# Patient Record
Sex: Male | Born: 1945 | Race: White | Hispanic: No | Marital: Married | Smoking: Former smoker
Health system: Southern US, Community
[De-identification: ages and names within clinical notes are randomized; demographics above are authoritative.]

## PROBLEM LIST (undated history)

## (undated) DIAGNOSIS — J189 Pneumonia, unspecified organism: Secondary | ICD-10-CM

## (undated) DIAGNOSIS — C439 Malignant melanoma of skin, unspecified: Secondary | ICD-10-CM

## (undated) DIAGNOSIS — I10 Essential (primary) hypertension: Secondary | ICD-10-CM

## (undated) DIAGNOSIS — E7800 Pure hypercholesterolemia, unspecified: Secondary | ICD-10-CM

## (undated) DIAGNOSIS — I2510 Atherosclerotic heart disease of native coronary artery without angina pectoris: Secondary | ICD-10-CM

## (undated) DIAGNOSIS — K219 Gastro-esophageal reflux disease without esophagitis: Secondary | ICD-10-CM

## (undated) DIAGNOSIS — I509 Heart failure, unspecified: Secondary | ICD-10-CM

## (undated) DIAGNOSIS — M1990 Unspecified osteoarthritis, unspecified site: Secondary | ICD-10-CM

## (undated) DIAGNOSIS — E119 Type 2 diabetes mellitus without complications: Secondary | ICD-10-CM

## (undated) HISTORY — PX: CATARACT EXTRACTION W/ INTRAOCULAR LENS  IMPLANT, BILATERAL: SHX1307

## (undated) HISTORY — PX: CORONARY ANGIOPLASTY WITH STENT PLACEMENT: SHX49

## (undated) HISTORY — PX: OTHER SURGICAL HISTORY: SHX169

## (undated) HISTORY — PX: COLONOSCOPY: SHX174

## (undated) HISTORY — PX: KNEE ARTHROSCOPY: SHX127

---

## 2008-10-24 DIAGNOSIS — C439 Malignant melanoma of skin, unspecified: Secondary | ICD-10-CM

## 2008-10-24 HISTORY — DX: Malignant melanoma of skin, unspecified: C43.9

## 2008-10-24 HISTORY — PX: MELANOMA EXCISION: SHX5266

## 2009-11-22 ENCOUNTER — Emergency Department (HOSPITAL_COMMUNITY): Admission: EM | Admit: 2009-11-22 | Discharge: 2009-11-22 | Payer: Self-pay | Admitting: Emergency Medicine

## 2014-10-24 HISTORY — PX: LITHOTRIPSY: SUR834

## 2015-06-03 ENCOUNTER — Emergency Department (HOSPITAL_COMMUNITY): Payer: Medicare Other

## 2015-06-03 ENCOUNTER — Encounter (HOSPITAL_COMMUNITY): Payer: Self-pay | Admitting: *Deleted

## 2015-06-03 ENCOUNTER — Emergency Department (HOSPITAL_COMMUNITY)
Admission: EM | Admit: 2015-06-03 | Discharge: 2015-06-03 | Disposition: A | Discharge status: Home / Self Care | Payer: Medicare Other | Attending: Physician Assistant | Admitting: Physician Assistant

## 2015-06-03 DIAGNOSIS — N200 Calculus of kidney: Secondary | ICD-10-CM | POA: Diagnosis not present

## 2015-06-03 DIAGNOSIS — R109 Unspecified abdominal pain: Secondary | ICD-10-CM | POA: Insufficient documentation

## 2015-06-03 HISTORY — DX: Essential (primary) hypertension: I10

## 2015-06-03 LAB — URINALYSIS, ROUTINE W REFLEX MICROSCOPIC
Bilirubin Urine: NEGATIVE
Glucose, UA: 100 mg/dL — AB
Ketones, ur: NEGATIVE mg/dL
LEUKOCYTES UA: NEGATIVE
Nitrite: NEGATIVE
Protein, ur: NEGATIVE mg/dL
Specific Gravity, Urine: 1.021 (ref 1.005–1.030)
Urobilinogen, UA: 0.2 mg/dL (ref 0.0–1.0)
pH: 6.5 (ref 5.0–8.0)

## 2015-06-03 LAB — I-STAT TROPONIN, ED: Troponin i, poc: 0.01 ng/mL (ref 0.00–0.08)

## 2015-06-03 LAB — CBC WITH DIFFERENTIAL/PLATELET
BASOS PCT: 0 % (ref 0–1)
Basophils Absolute: 0 10*3/uL (ref 0.0–0.1)
Eosinophils Absolute: 0.1 10*3/uL (ref 0.0–0.7)
Eosinophils Relative: 0 % (ref 0–5)
HEMATOCRIT: 46.3 % (ref 39.0–52.0)
HEMOGLOBIN: 16.1 g/dL (ref 13.0–17.0)
LYMPHS ABS: 2.1 10*3/uL (ref 0.7–4.0)
LYMPHS PCT: 15 % (ref 12–46)
MCH: 31.6 pg (ref 26.0–34.0)
MCHC: 34.8 g/dL (ref 30.0–36.0)
MCV: 90.8 fL (ref 78.0–100.0)
MONOS PCT: 8 % (ref 3–12)
Monocytes Absolute: 1.2 10*3/uL — ABNORMAL HIGH (ref 0.1–1.0)
NEUTROS ABS: 10.8 10*3/uL — AB (ref 1.7–7.7)
NEUTROS PCT: 77 % (ref 43–77)
Platelets: 236 10*3/uL (ref 150–400)
RBC: 5.1 MIL/uL (ref 4.22–5.81)
RDW: 12.5 % (ref 11.5–15.5)
WBC: 14.1 10*3/uL — AB (ref 4.0–10.5)

## 2015-06-03 LAB — URINE MICROSCOPIC-ADD ON

## 2015-06-03 LAB — BASIC METABOLIC PANEL
Anion gap: 12 (ref 5–15)
BUN: 25 mg/dL — AB (ref 6–20)
CO2: 23 mmol/L (ref 22–32)
Calcium: 9.8 mg/dL (ref 8.9–10.3)
Chloride: 104 mmol/L (ref 101–111)
Creatinine, Ser: 1.53 mg/dL — ABNORMAL HIGH (ref 0.61–1.24)
GFR calc Af Amer: 53 mL/min — ABNORMAL LOW (ref >60)
GFR calc non Af Amer: 45 mL/min — ABNORMAL LOW (ref >60)
GLUCOSE: 147 mg/dL — AB (ref 65–99)
Potassium: 4.1 mmol/L (ref 3.5–5.1)
SODIUM: 139 mmol/L (ref 135–145)

## 2015-06-03 LAB — LIPASE, BLOOD: Lipase: 43 U/L (ref 22–51)

## 2015-06-03 MED ORDER — OXYCODONE-ACETAMINOPHEN 5-325 MG PO TABS: 1.0000 | ORAL_TABLET | Freq: Four times a day (QID) | ORAL | Status: DC | PRN

## 2015-06-03 MED ORDER — TAMSULOSIN HCL 0.4 MG PO CAPS: 0.4000 mg | ORAL_CAPSULE | Freq: Every day | ORAL | Status: DC

## 2015-06-03 MED ORDER — ONDANSETRON HCL 4 MG PO TABS: 4.0000 mg | ORAL_TABLET | Freq: Three times a day (TID) | ORAL | Status: DC | PRN

## 2015-06-03 NOTE — ED Notes (Signed)
Pt returned from X-ray.  

## 2015-06-03 NOTE — ED Provider Notes (Signed)
CSN: [id number]     Arrival date & time 06/03/15  7408 History   First MD Initiated Contact with Patient 06/03/15 0930     Chief Complaint  Patient presents with  . Flank Pain     (Consider location/radiation/quality/duration/timing/severity/associated sxs/prior Treatment) HPI   Patient is a 69 year old male presenting today with back pain radiating to his left inguinal area. Pain woke him up in the middle night. Patient's had no trouble urinating. No fevers. No nausea no vomiting no diarrhea. One soft stool yesterday morning. Patient did do some vigorous yard work yesterday. It is somewhat made worse by moving. Patient has no history of kidney stones. Past history only significant for cardiac disease and GERD.  No past medical history on file. No past surgical history on file. No family history on file. Social History  Substance Use Topics  . Smoking status: Not on file  . Smokeless tobacco: Not on file  . Alcohol Use: Not on file    Review of Systems  Constitutional: Negative for fever and activity change.  HENT: Negative for drooling and hearing loss.   Eyes: Negative for discharge and redness.  Respiratory: Negative for cough and shortness of breath.   Cardiovascular: Negative for chest pain.  Gastrointestinal: Negative for nausea, abdominal pain and diarrhea.  Genitourinary: Negative for dysuria and urgency.  Musculoskeletal: Negative for arthralgias.  Allergic/Immunologic: Negative for immunocompromised state.  Neurological: Negative for seizures and speech difficulty.  Psychiatric/Behavioral: Negative for behavioral problems and agitation.  All other systems reviewed and are negative.     Allergies  Review of patient's allergies indicates not on file.  Home Medications   Prior to Admission medications   Not on File   BP 153/92 mmHg  Pulse 70  Temp(Src) 98.7 F (37.1 C) (Oral)  Resp 20  SpO2 94% Physical Exam  Constitutional: He is oriented to person,  place, and time. He appears well-nourished.  HENT:  Head: Normocephalic.  Mouth/Throat: Oropharynx is clear and moist.  Eyes: Conjunctivae are normal.  Neck: No tracheal deviation present.  Cardiovascular: Normal rate.   Pulmonary/Chest: Effort normal. No stridor. No respiratory distress.  Abdominal: Soft. There is no tenderness. There is no guarding.  No CVA tenderness. No abdominal tenderness.  Musculoskeletal: Normal range of motion. He exhibits no edema.  Neurological: He is oriented to person, place, and time. No cranial nerve deficit.  Skin: Skin is warm and dry. No rash noted. He is not diaphoretic.  Psychiatric: He has a normal mood and affect. His behavior is normal.  Nursing note and vitals reviewed.   ED Course  Procedures (including critical care time) Labs Review Labs Reviewed  URINALYSIS, ROUTINE W REFLEX MICROSCOPIC (NOT AT [facility])    Imaging Review No results found.   EKG Interpretation None      MDM   Final diagnoses:  None    She is a healthy 69 year old gentleman with past medical history significant for cardiac disease and GERD. He is presenting today with left flank pain radiating to his left groin. Differential includes kidney stone, gas pains, msk pain, and dissection. No pain is reproducible on exam. We'll get urine to look for evidence of kidney stone. If no evidence of etiology of pain on urine (blood- non con CT)  Then we'll get CT with contrast. Given patient's risk factors for dissection or other abdominal etiology of this pain.  Patient's vital signs and physical exam are normal.    Discussed with urology. They are okay with follow  up as outpatient.     Courteney Julio Alm, MD 06/03/15 [phone]

## 2015-06-03 NOTE — ED Notes (Signed)
Went to collect blood samples,RN was in the process of starting an IV and stated that they would collect the samples

## 2015-06-03 NOTE — ED Notes (Signed)
Pt reports sudden onset L flank pain, L abd pain that woke him up out of sleep at 4am today. +nausea, denies vomiting. Denies urinary complaints. Denies Hx kidney stones.

## 2015-06-03 NOTE — Discharge Instructions (Signed)
You have a kidney stone. Please return to see her regular physician this week. You need your creatinine checked.

## 2015-06-04 LAB — URINE CULTURE: Culture: NO GROWTH

## 2015-07-01 ENCOUNTER — Other Ambulatory Visit: Payer: Self-pay | Admitting: Urology

## 2015-07-07 ENCOUNTER — Encounter (HOSPITAL_COMMUNITY): Payer: Self-pay | Admitting: *Deleted

## 2015-07-09 ENCOUNTER — Encounter (HOSPITAL_COMMUNITY): Payer: Self-pay

## 2015-07-09 ENCOUNTER — Encounter (HOSPITAL_COMMUNITY)
Admission: RE | Disposition: A | Discharge status: Home / Self Care | Payer: Self-pay | Source: Ambulatory Visit | Attending: Urology

## 2015-07-09 ENCOUNTER — Ambulatory Visit (HOSPITAL_COMMUNITY): Payer: Medicare Other

## 2015-07-09 ENCOUNTER — Ambulatory Visit (HOSPITAL_COMMUNITY)
Admission: RE | Admit: 2015-07-09 | Discharge: 2015-07-09 | Disposition: A | Discharge status: Home / Self Care | Payer: Medicare Other | Source: Ambulatory Visit | Attending: Urology | Admitting: Urology

## 2015-07-09 DIAGNOSIS — Z79899 Other long term (current) drug therapy: Secondary | ICD-10-CM | POA: Diagnosis not present

## 2015-07-09 DIAGNOSIS — E780 Pure hypercholesterolemia: Secondary | ICD-10-CM | POA: Insufficient documentation

## 2015-07-09 DIAGNOSIS — N201 Calculus of ureter: Secondary | ICD-10-CM

## 2015-07-09 DIAGNOSIS — Z6828 Body mass index (BMI) 28.0-28.9, adult: Secondary | ICD-10-CM | POA: Insufficient documentation

## 2015-07-09 DIAGNOSIS — I10 Essential (primary) hypertension: Secondary | ICD-10-CM | POA: Insufficient documentation

## 2015-07-09 DIAGNOSIS — Z87891 Personal history of nicotine dependence: Secondary | ICD-10-CM | POA: Diagnosis not present

## 2015-07-09 DIAGNOSIS — Z7982 Long term (current) use of aspirin: Secondary | ICD-10-CM | POA: Diagnosis not present

## 2015-07-09 LAB — GLUCOSE, CAPILLARY: GLUCOSE-CAPILLARY: 103 mg/dL — AB (ref 65–99)

## 2015-07-09 SURGERY — LITHOTRIPSY, ESWL
Anesthesia: LOCAL | Laterality: Left

## 2015-07-09 MED ORDER — TRAMADOL HCL 50 MG PO TABS: 50.0000 mg | ORAL_TABLET | Freq: Four times a day (QID) | ORAL | Status: DC | PRN

## 2015-07-09 MED ORDER — DIPHENHYDRAMINE HCL 25 MG PO CAPS
25.0000 mg | ORAL_CAPSULE | ORAL | Status: AC
  Administered 2015-07-09: 25 mg via ORAL
  Filled 2015-07-09: qty 1

## 2015-07-09 MED ORDER — DIAZEPAM 5 MG PO TABS
10.0000 mg | ORAL_TABLET | ORAL | Status: AC
  Administered 2015-07-09: 10 mg via ORAL
  Filled 2015-07-09: qty 2

## 2015-07-09 MED ORDER — CIPROFLOXACIN HCL 500 MG PO TABS
500.0000 mg | ORAL_TABLET | ORAL | Status: AC
Start: 2015-07-09 — End: 2015-07-09
  Administered 2015-07-09: 500 mg via ORAL
  Filled 2015-07-09: qty 1

## 2015-07-09 MED ORDER — SODIUM CHLORIDE 0.9 % IV SOLN
INTRAVENOUS | Status: DC
  Administered 2015-07-09: 09:00:00 via INTRAVENOUS

## 2015-07-09 NOTE — Op Note (Signed)
See [facility] OP note scanned into chart. Also because of the size, density, location and other factors that cannot be anticipated I feel this will likely be a staged procedure. This fact supersedes any indication in the scanned [facility] operative note to the contrary.  

## 2015-07-09 NOTE — Interval H&P Note (Signed)
History and Physical Interval Note: No changes. NAD RRR CTA-B  Ok to proceed with scheduled procedure. 07/09/2015 5:15 AM  Brandon Morrow  has presented today for surgery, with the diagnosis of LEFT MID URETERAL STONE  The various methods of treatment have been discussed with the patient and family. After consideration of risks, benefits and other options for treatment, the patient has consented to  Procedure(s): LEFT EXTRACORPOREAL SHOCK WAVE LITHOTRIPSY (ESWL) (Left) as a surgical intervention .  The patient's history has been reviewed, patient examined, no change in status, stable for surgery.  I have reviewed the patient's chart and labs.  Questions were answered to the patient's satisfaction.     Louis Meckel W

## 2015-07-09 NOTE — Discharge Instructions (Signed)
See [facility] discharge instructions in chart.  

## 2015-07-09 NOTE — H&P (Signed)
Reason For Visit    Brandon Morrow is a 69 year old male with a left ureteral stone.   History of Present Illness Calculus disease: CT scan 06/03/15 - 6 mm stone in the left ureter located at the level of the L4 vertebral body. No renal calculi were identified.    Interval history: He was placed on medical expulsive therapy. He has been doing well and has not had any flank pain or hematuria. He has not seen his stone passed. No new complaints are noted today.    PSA in 3/16 was 0.03   Past Medical History Problems  1. History of hypercholesterolemia (Z86.39) 2. History of Morbid obesity (E66.01)  Surgical History Problems  1. History of Arthroscopy Knee 2. History of Cataract Surgery  Current Meds 1. AmLODIPine Besylate 5 MG Oral Tablet;  Therapy: (Recorded:25Aug2016) to Recorded 2. Aspirin 325 MG Oral Tablet;  Therapy: (Recorded:25Aug2016) to Recorded 3. Fish Oil Concentrate 1000 MG Oral Capsule;  Therapy: (Recorded:25Aug2016) to Recorded 4. Latanoprost 0.005 % Ophthalmic Solution;  Therapy: (Recorded:25Aug2016) to Recorded 5. Lipitor 10 MG Oral Tablet;  Therapy: (Recorded:25Aug2016) to Recorded 6. Loratadine 10 MG Oral Tablet;  Therapy: (Recorded:25Aug2016) to Recorded 7. MetFORMIN HCl - 1000 MG Oral Tablet;  Therapy: (Recorded:25Aug2016) to Recorded 8. Multi-Vitamin TABS;  Therapy: (Recorded:25Aug2016) to Recorded 9. Vitamin D 400 UNIT CAPS;  Therapy: (Recorded:25Aug2016) to Recorded  Allergies Medication  1. Codeine Derivatives 2. Sulfa Drugs  Family History Problems  1. Family history of diabetes mellitus (Z83.3) : Mother, Sister 2. Family history of malignant neoplasm of breast (Z80.3) : Mother 3. Family history of Parkinson's disease (Z82.0) : Father  Social History Problems  1. Alcohol use (Z78.9)   maybe 1 drink per week 2. Former smoker [phone]) 3. Married  Review of Systems Genitourinary, constitutional, skin, eye, otolaryngeal,  hematologic/lymphatic, cardiovascular, pulmonary, endocrine, musculoskeletal, gastrointestinal, neurological and psychiatric system(s) were reviewed and pertinent findings if present are noted and are otherwise negative.  Gastrointestinal: flank pain and heartburn.  ENT: sinus problems.  Musculoskeletal: back pain and joint pain.    Vitals Vital Signs [Data Includes: Last 1 Day]  Recorded: 07Sep2016 09:54AM  Height: 5 ft 9 in Weight: 195 lb  BMI Calculated: 28.8 BSA Calculated: 2.04 Blood Pressure: 120 / 65 Temperature: 97.4 F Heart Rate: 72  Results/Data Urine [Data Includes: Last 1 Day]   [id number]  COLOR YELLOW   APPEARANCE CLEAR   SPECIFIC GRAVITY 1.010   pH 6.0   GLUCOSE 2+   BILIRUBIN NEGATIVE   KETONE NEGATIVE   BLOOD TRACE   PROTEIN NEGATIVE   NITRITE NEGATIVE   LEUKOCYTE ESTERASE NEGATIVE   SQUAMOUS EPITHELIAL/HPF 0-5 HPF  WBC 0-5 WBC/HPF  RBC 0-2 RBC/HPF  BACTERIA NONE SEEN HPF  CRYSTALS NONE SEEN HPF  CASTS NONE SEEN LPF  Yeast NONE SEEN HPF   Assessment Assessed  1. Ureteral calculus, left (N20.1)     It appears his down still present within the left ureter. It is at the level of the L3-L4 interspace on the left-hand side.  My measurement of the stones with is 4 mm wide.  His studies failed to progress. Fortunately he is not having any pain. We did discuss the concept of solid obstruction and the need for treatment. I had previously described the options to him but I again went over lithotripsy and ureteroscopy in detail. He is elected to proceed with lithotripsy.   Plan Health Maintenance  1. UA With REFLEX; [Do Not Release]; Status:Complete;   Done: [id number]  09:45AM Ureteral calculus, left  2. Start: Tamsulosin HCl - 0.4 MG Oral Capsule; TAKE 1 CAPSULE Daily 3. Follow-up Schedule Surgery Office  Follow-up  Status: Hold For - Appointment   Requested for: 07Sep2016  1. Continue tamsulosin.  2. He will be scheduled for lithotripsy of his left  renal stone.

## 2015-09-08 ENCOUNTER — Encounter: Payer: Self-pay | Admitting: Internal Medicine

## 2015-10-07 ENCOUNTER — Other Ambulatory Visit: Payer: Self-pay | Admitting: Urology

## 2015-10-08 ENCOUNTER — Encounter (HOSPITAL_BASED_OUTPATIENT_CLINIC_OR_DEPARTMENT_OTHER): Payer: Self-pay | Admitting: *Deleted

## 2015-10-08 NOTE — Progress Notes (Signed)
NPO AFTER MN.  ARRIVE AT 0745.  NEEDS ISTAT , KUB, AND EKG.

## 2015-10-12 ENCOUNTER — Ambulatory Visit (HOSPITAL_COMMUNITY): Payer: Medicare Other

## 2015-10-12 ENCOUNTER — Ambulatory Visit (HOSPITAL_BASED_OUTPATIENT_CLINIC_OR_DEPARTMENT_OTHER): Payer: Medicare Other | Admitting: Anesthesiology

## 2015-10-12 ENCOUNTER — Encounter (HOSPITAL_BASED_OUTPATIENT_CLINIC_OR_DEPARTMENT_OTHER)
Admission: RE | Disposition: A | Discharge status: Home / Self Care | Payer: Self-pay | Source: Ambulatory Visit | Attending: Urology

## 2015-10-12 ENCOUNTER — Ambulatory Visit (HOSPITAL_BASED_OUTPATIENT_CLINIC_OR_DEPARTMENT_OTHER)
Admission: RE | Admit: 2015-10-12 | Discharge: 2015-10-12 | Disposition: A | Discharge status: Home / Self Care | Payer: Medicare Other | Source: Ambulatory Visit | Attending: Urology | Admitting: Urology

## 2015-10-12 ENCOUNTER — Encounter (HOSPITAL_BASED_OUTPATIENT_CLINIC_OR_DEPARTMENT_OTHER): Payer: Self-pay | Admitting: Anesthesiology

## 2015-10-12 DIAGNOSIS — Z79899 Other long term (current) drug therapy: Secondary | ICD-10-CM | POA: Diagnosis not present

## 2015-10-12 DIAGNOSIS — E7800 Pure hypercholesterolemia, unspecified: Secondary | ICD-10-CM | POA: Insufficient documentation

## 2015-10-12 DIAGNOSIS — Z7982 Long term (current) use of aspirin: Secondary | ICD-10-CM | POA: Insufficient documentation

## 2015-10-12 DIAGNOSIS — Z87891 Personal history of nicotine dependence: Secondary | ICD-10-CM | POA: Diagnosis not present

## 2015-10-12 DIAGNOSIS — Z6828 Body mass index (BMI) 28.0-28.9, adult: Secondary | ICD-10-CM | POA: Insufficient documentation

## 2015-10-12 DIAGNOSIS — H409 Unspecified glaucoma: Secondary | ICD-10-CM | POA: Diagnosis not present

## 2015-10-12 DIAGNOSIS — N201 Calculus of ureter: Secondary | ICD-10-CM | POA: Insufficient documentation

## 2015-10-12 DIAGNOSIS — E119 Type 2 diabetes mellitus without complications: Secondary | ICD-10-CM | POA: Diagnosis not present

## 2015-10-12 DIAGNOSIS — Z87442 Personal history of urinary calculi: Secondary | ICD-10-CM | POA: Diagnosis not present

## 2015-10-12 DIAGNOSIS — Z7984 Long term (current) use of oral hypoglycemic drugs: Secondary | ICD-10-CM | POA: Insufficient documentation

## 2015-10-12 DIAGNOSIS — I10 Essential (primary) hypertension: Secondary | ICD-10-CM | POA: Insufficient documentation

## 2015-10-12 HISTORY — PX: HOLMIUM LASER APPLICATION: SHX5852

## 2015-10-12 HISTORY — PX: CYSTOSCOPY WITH RETROGRADE PYELOGRAM, URETEROSCOPY AND STENT PLACEMENT: SHX5789

## 2015-10-12 LAB — POCT I-STAT 4, (NA,K, GLUC, HGB,HCT)
Glucose, Bld: 165 mg/dL — ABNORMAL HIGH (ref 65–99)
HCT: 45 % (ref 39.0–52.0)
Hemoglobin: 15.3 g/dL (ref 13.0–17.0)
Potassium: 3.7 mmol/L (ref 3.5–5.1)
Sodium: 139 mmol/L (ref 135–145)

## 2015-10-12 LAB — GLUCOSE, CAPILLARY: Glucose-Capillary: 152 mg/dL — ABNORMAL HIGH (ref 65–99)

## 2015-10-12 SURGERY — CYSTOURETEROSCOPY, WITH RETROGRADE PYELOGRAM AND STENT INSERTION
Anesthesia: General | Laterality: Left

## 2015-10-12 MED ORDER — FENTANYL CITRATE (PF) 100 MCG/2ML IJ SOLN
INTRAMUSCULAR | Status: AC
  Filled 2015-10-12: qty 2

## 2015-10-12 MED ORDER — CIPROFLOXACIN IN D5W 200 MG/100ML IV SOLN
200.0000 mg | INTRAVENOUS | Status: AC
  Administered 2015-10-12: 200 mg via INTRAVENOUS
  Filled 2015-10-12: qty 100

## 2015-10-12 MED ORDER — TRAMADOL HCL 50 MG PO TABS
ORAL_TABLET | ORAL | Status: AC
  Filled 2015-10-12: qty 1

## 2015-10-12 MED ORDER — TRAMADOL HCL 50 MG PO TABS: 50.0000 mg | ORAL_TABLET | Freq: Four times a day (QID) | ORAL | Status: DC | PRN

## 2015-10-12 MED ORDER — KETOROLAC TROMETHAMINE 30 MG/ML IJ SOLN
INTRAMUSCULAR | Status: AC
  Filled 2015-10-12: qty 1

## 2015-10-12 MED ORDER — PHENAZOPYRIDINE HCL 100 MG PO TABS
ORAL_TABLET | ORAL | Status: AC
  Filled 2015-10-12: qty 2

## 2015-10-12 MED ORDER — DEXAMETHASONE SODIUM PHOSPHATE 10 MG/ML IJ SOLN
INTRAMUSCULAR | Status: AC
  Filled 2015-10-12: qty 1

## 2015-10-12 MED ORDER — LACTATED RINGERS IV SOLN
INTRAVENOUS | Status: DC
  Administered 2015-10-12: 08:00:00 via INTRAVENOUS
  Filled 2015-10-12: qty 1000

## 2015-10-12 MED ORDER — PROPOFOL 10 MG/ML IV BOLUS
INTRAVENOUS | Status: AC
  Filled 2015-10-12: qty 20

## 2015-10-12 MED ORDER — MIDAZOLAM HCL 2 MG/2ML IJ SOLN
INTRAMUSCULAR | Status: AC
  Filled 2015-10-12: qty 2

## 2015-10-12 MED ORDER — LIDOCAINE HCL (CARDIAC) 20 MG/ML IV SOLN
INTRAVENOUS | Status: AC
  Filled 2015-10-12: qty 5

## 2015-10-12 MED ORDER — PROPOFOL 10 MG/ML IV BOLUS
INTRAVENOUS | Status: DC | PRN
  Administered 2015-10-12: 200 mg via INTRAVENOUS

## 2015-10-12 MED ORDER — FENTANYL CITRATE (PF) 100 MCG/2ML IJ SOLN
25.0000 ug | INTRAMUSCULAR | Status: DC | PRN
  Filled 2015-10-12: qty 1

## 2015-10-12 MED ORDER — LIDOCAINE HCL (CARDIAC) 20 MG/ML IV SOLN
INTRAVENOUS | Status: DC | PRN
  Administered 2015-10-12: 100 mg via INTRAVENOUS

## 2015-10-12 MED ORDER — FENTANYL CITRATE (PF) 100 MCG/2ML IJ SOLN
INTRAMUSCULAR | Status: DC | PRN
  Administered 2015-10-12 (×2): 25 ug via INTRAVENOUS
  Administered 2015-10-12: 50 ug via INTRAVENOUS

## 2015-10-12 MED ORDER — CIPROFLOXACIN IN D5W 200 MG/100ML IV SOLN
INTRAVENOUS | Status: AC
  Filled 2015-10-12: qty 100

## 2015-10-12 MED ORDER — EPHEDRINE SULFATE 50 MG/ML IJ SOLN
INTRAMUSCULAR | Status: AC
  Filled 2015-10-12: qty 1

## 2015-10-12 MED ORDER — LACTATED RINGERS IV SOLN
INTRAVENOUS | Status: DC
  Filled 2015-10-12: qty 1000

## 2015-10-12 MED ORDER — TRAMADOL HCL 50 MG PO TABS
50.0000 mg | ORAL_TABLET | Freq: Four times a day (QID) | ORAL | Status: AC | PRN
  Administered 2015-10-12: 50 mg via ORAL
  Filled 2015-10-12: qty 1

## 2015-10-12 MED ORDER — ONDANSETRON HCL 4 MG/2ML IJ SOLN
INTRAMUSCULAR | Status: AC
  Filled 2015-10-12: qty 2

## 2015-10-12 MED ORDER — PHENAZOPYRIDINE HCL 200 MG PO TABS
200.0000 mg | ORAL_TABLET | Freq: Three times a day (TID) | ORAL | Status: DC
  Administered 2015-10-12: 200 mg via ORAL
  Filled 2015-10-12: qty 1

## 2015-10-12 MED ORDER — PHENAZOPYRIDINE HCL 200 MG PO TABS: 200.0000 mg | ORAL_TABLET | Freq: Three times a day (TID) | ORAL | Status: DC | PRN

## 2015-10-12 MED ORDER — DEXAMETHASONE SODIUM PHOSPHATE 4 MG/ML IJ SOLN
INTRAMUSCULAR | Status: DC | PRN
  Administered 2015-10-12: 8 mg via INTRAVENOUS

## 2015-10-12 MED ORDER — EPHEDRINE SULFATE 50 MG/ML IJ SOLN
INTRAMUSCULAR | Status: DC | PRN
  Administered 2015-10-12: 10 mg via INTRAVENOUS

## 2015-10-12 MED ORDER — ONDANSETRON HCL 4 MG/2ML IJ SOLN
INTRAMUSCULAR | Status: DC | PRN
  Administered 2015-10-12: 4 mg via INTRAVENOUS

## 2015-10-12 MED ORDER — IOHEXOL 350 MG/ML SOLN
INTRAVENOUS | Status: DC | PRN
  Administered 2015-10-12: 9 mL via URETHRAL

## 2015-10-12 SURGICAL SUPPLY — 39 items
ADAPTER CATH URET PLST 4-6FR (CATHETERS) IMPLANT
BAG DRAIN URO-CYSTO SKYTR STRL (DRAIN) ×2 IMPLANT
BASKET DAKOTA 1.9FR 11X120 (BASKET) ×2 IMPLANT
BASKET LASER NITINOL 1.9FR (BASKET) IMPLANT
BASKET STNLS GEMINI 4WIRE 3FR (BASKET) IMPLANT
BASKET ZERO TIP NITINOL 2.4FR (BASKET) IMPLANT
CANISTER SUCT LVC 12 LTR MEDI- (MISCELLANEOUS) IMPLANT
CATH CLEAR GEL 3F BACKSTOP (CATHETERS) IMPLANT
CATH INTERMIT  6FR 70CM (CATHETERS) IMPLANT
CATH ROBINSON RED A/P 16FR (CATHETERS) ×2 IMPLANT
CATH URET 5FR 28IN CONE TIP (BALLOONS)
CATH URET 5FR 70CM CONE TIP (BALLOONS) IMPLANT
CLOTH BEACON ORANGE TIMEOUT ST (SAFETY) ×2 IMPLANT
ELECT REM PT RETURN 9FT ADLT (ELECTROSURGICAL)
ELECTRODE REM PT RTRN 9FT ADLT (ELECTROSURGICAL) IMPLANT
FIBER LASER FLEXIVA 200 (UROLOGICAL SUPPLIES) ×2 IMPLANT
FIBER LASER FLEXIVA 365 (UROLOGICAL SUPPLIES) IMPLANT
FIBER LASER FLEXIVA 550 (UROLOGICAL SUPPLIES) IMPLANT
FIBER LASER TRAC TIP (UROLOGICAL SUPPLIES) IMPLANT
GLOVE BIO SURGEON STRL SZ8 (GLOVE) ×2 IMPLANT
GOWN STRL REUS W/ TWL LRG LVL3 (GOWN DISPOSABLE) ×1 IMPLANT
GOWN STRL REUS W/ TWL XL LVL3 (GOWN DISPOSABLE) ×1 IMPLANT
GOWN STRL REUS W/TWL LRG LVL3 (GOWN DISPOSABLE) ×1
GOWN STRL REUS W/TWL XL LVL3 (GOWN DISPOSABLE) ×1
GUIDEWIRE 0.038 PTFE COATED (WIRE) IMPLANT
GUIDEWIRE ANG ZIPWIRE 038X150 (WIRE) IMPLANT
GUIDEWIRE STR DUAL SENSOR (WIRE) ×2 IMPLANT
IV NS IRRIG 3000ML ARTHROMATIC (IV SOLUTION) ×4 IMPLANT
KIT BALLIN UROMAX 15FX10 (LABEL) IMPLANT
KIT BALLN UROMAX 15FX4 (MISCELLANEOUS) IMPLANT
KIT BALLN UROMAX 26 75X4 (MISCELLANEOUS)
KIT ROOM TURNOVER WOR (KITS) ×2 IMPLANT
MANIFOLD NEPTUNE II (INSTRUMENTS) IMPLANT
PACK CYSTO (CUSTOM PROCEDURE TRAY) ×2 IMPLANT
SET HIGH PRES BAL DIL (LABEL)
SHEATH ACCESS URETERAL 38CM (SHEATH) ×2 IMPLANT
STENT POLARIS 5FRX24 (STENTS) ×2 IMPLANT
TUBE CONNECTING 12X1/4 (SUCTIONS) IMPLANT
WATER STERILE IRR 3000ML UROMA (IV SOLUTION) IMPLANT

## 2015-10-12 NOTE — Anesthesia Procedure Notes (Signed)
Procedure Name: LMA Insertion Date/Time: 10/12/2015 9:25 AM Performed by: Mechele Claude Pre-anesthesia Checklist: Patient identified, Emergency Drugs available, Suction available and Patient being monitored Patient Re-evaluated:Patient Re-evaluated prior to inductionOxygen Delivery Method: Circle System Utilized Preoxygenation: Pre-oxygenation with 100% oxygen Intubation Type: IV induction Ventilation: Mask ventilation without difficulty LMA: LMA inserted LMA Size: 4.0 Number of attempts: 1 Airway Equipment and Method: bite block Placement Confirmation: positive ETCO2 Tube secured with: Tape Dental Injury: Teeth and Oropharynx as per pre-operative assessment

## 2015-10-12 NOTE — Anesthesia Preprocedure Evaluation (Signed)
Anesthesia Evaluation  Patient identified by MRN, date of birth, ID band Patient awake    Reviewed: Allergy & Precautions, H&P , NPO status , Patient's Chart, lab work & pertinent test results  Airway Mallampati: II  TM Distance: >3 FB Neck ROM: full    Dental no notable dental hx. (+) Dental Advisory Given, Teeth Intact   Pulmonary neg pulmonary ROS, former smoker,    Pulmonary exam normal breath sounds clear to auscultation       Cardiovascular Exercise Tolerance: Good hypertension, Pt. on medications Normal cardiovascular exam Rhythm:regular Rate:Normal     Neuro/Psych glaucoma negative neurological ROS  negative psych ROS   GI/Hepatic negative GI ROS, Neg liver ROS,   Endo/Other  diabetes, Well Controlled, Type 2, Oral Hypoglycemic Agents  Renal/GU negative Renal ROS  negative genitourinary   Musculoskeletal   Abdominal   Peds  Hematology negative hematology ROS (+)   Anesthesia Other Findings   Reproductive/Obstetrics negative OB ROS                             Anesthesia Physical Anesthesia Plan  ASA: III  Anesthesia Plan: General   Post-op Pain Management:    Induction: Intravenous  Airway Management Planned: LMA  Additional Equipment:   Intra-op Plan:   Post-operative Plan:   Informed Consent: I have reviewed the patients History and Physical, chart, labs and discussed the procedure including the risks, benefits and alternatives for the proposed anesthesia with the patient or authorized representative who has indicated his/her understanding and acceptance.   Dental Advisory Given  Plan Discussed with: CRNA and Surgeon  Anesthesia Plan Comments:         Anesthesia Quick Evaluation

## 2015-10-12 NOTE — Op Note (Signed)
PATIENT:  Brandon Morrow  PRE-OPERATIVE DIAGNOSIS:  left Ureteral calculus  POST-OPERATIVE DIAGNOSIS: Same  PROCEDURE:  1. Cystoscopy with left retrograde pyelogram including interpretation 2. Left ureteroscopy, laser lithotripsy and stone extraction 3. Left ureteral stent placement  SURGEON: Claybon Jabs, MD  INDICATION: Brandon Morrow is a 69 year old male who was found to have a left proximal ureteral stone and underwent treatment with ESWL on 07/09/15. This did not result in any significant fragmentation of the stone or passage of stone fragments. He was maintained on medical expulsive therapy as he was having no pain but due to the lack of progression of his stone he is brought to the operating room today for ureteroscopic management.  ANESTHESIA:  General  EBL:  Minimal  DRAINS: 4.8 French, 24 cm Polaris stent in the left ureter (with string)  SPECIMEN:   Stone taken to my office for composition analysis.  DESCRIPTION OF PROCEDURE: The patient was taken to the major OR and placed on the table. General anesthesia was administered and then the patient was moved to the dorsal lithotomy position. The genitalia was sterilely prepped and draped. An official timeout was performed.  Initially the 23 French cystoscope was used to attempt cystoscopy but urethral meatus was too narrow to accept this scope. I therefore used the [employer] and was able to dilate him up to 79 [place] but did not feel further dilatation would be wise as he was quite tight. I therefore elected to use the flexible ureteroscope and passed this under direct vision down the urethra and into the bladder. The left ureteral orifice was identified and a 0.038 inch floppy-tipped guidewire was then passed into the left ureter and into the area of the renal pelvis under fluoroscopy. I then passed the inner portion of a ureteral access sheath over the guidewire to gently dilate the intramural ureter. I passed this up the  ureter and then remove the guidewire. I then performed a left retrograde pyelogram using the inner portion of the access sheath.   A retrograde pyelogram was performed by injecting full-strength contrast up the left ureter under direct fluoroscopic control. It revealed a filling defect in the proximal ureter consistent with the stone seen on the preoperative KUB. The remainder of the ureter was noted to be normal as was the intrarenal collecting system. I then passed a 0.038 inch floppy-tipped guidewire through the access sheath and left this in place. I then used the ureteral access sheath and passed this over the guidewire up to an area just below where the stone was located and removed the inner portion of the access sheath as well as guidewire. I then proceeded with ureteroscopy.  A 6 French flexible ureteroscope was then passed through the access sheath and into the proximal ureter.  The stone was identified and I felt it was too large to extract and therefore elected to proceed with laser lithotripsy. The 200  holmium laser fiber was used to fragment the stone. I then used the [place] basket to extract all of the stone fragments and reinspection of the ureter ureteroscopically revealed no further stone fragments and no injury to the ureter.  I then passed the guidewire back through the access sheath into the area the renal pelvis and then removed the access sheath. I passed the stent over the guidewireand up to the level of the renal pelvis using fluoroscopy to visualize the radiopaque marker on the pusher on the distal end.   As the guidewire was  removed good curl was noted in the renal pelvis. The bladder was then drained by passing a 16 French red rubber catheter into the bladder and then removed after the bladder was completely drained.  The patient tolerated the procedure well no intraoperative complications.  PLAN OF CARE: Discharge to home after PACU  PATIENT DISPOSITION:  PACU - hemodynamically  stable.

## 2015-10-12 NOTE — Transfer of Care (Signed)
  Last Vitals:  Filed Vitals:   10/12/15 0823  BP: 136/77  Pulse: 73  Temp: 36.9 C  Resp: 22    Immediate Anesthesia Transfer of Care Note  Patient: Brandon Morrow  Procedure(s) Performed: Procedure(s) (LRB): LEFT RETROGRADE PYELOGRAM, LEFT URETEROSCOPY, LASER LITHOTRIPSY  AND STENT PLACEMENT (Left) HOLMIUM LASER APPLICATION (Left)  Patient Location: PACU  Anesthesia Type: General  Level of Consciousness: awake, alert  and oriented  Airway & Oxygen Therapy: Patient Spontanous Breathing and Patient connected to face mask oxygen  Post-op Assessment: Report given to PACU RN and Post -op Vital signs reviewed and stable  Post vital signs: Reviewed and stable  Complications: No apparent anesthesia complications

## 2015-10-12 NOTE — Anesthesia Postprocedure Evaluation (Signed)
Anesthesia Post Note  Patient: Brandon Morrow  Procedure(s) Performed: Procedure(s) (LRB): LEFT RETROGRADE PYELOGRAM, LEFT URETEROSCOPY, LASER LITHOTRIPSY  AND STENT PLACEMENT (Left) HOLMIUM LASER APPLICATION (Left)  Patient location during evaluation: PACU Anesthesia Type: General Level of consciousness: awake and alert Pain management: pain level controlled Vital Signs Assessment: post-procedure vital signs reviewed and stable Respiratory status: spontaneous breathing, nonlabored ventilation, respiratory function stable and patient connected to nasal cannula oxygen Cardiovascular status: blood pressure returned to baseline and stable Postop Assessment: no signs of nausea or vomiting Anesthetic complications: no    Last Vitals:  Filed Vitals:   10/12/15 1100 10/12/15 1115  BP: 130/74 147/78  Pulse: 77 81  Temp:    Resp: 11 17    Last Pain: There were no vitals filed for this visit.               Chrisann Melaragno L

## 2015-10-12 NOTE — Discharge Instructions (Signed)

## 2015-10-12 NOTE — H&P (Signed)
Brandon Morrow is a 69 year old male with a history of a left ureteral calculus.   History of Present Illness     Calculus disease: CT scan 06/03/15 - 6 mm stone in the left ureter located at the level of the L4 vertebral body. No renal calculi were identified.  Left ureteral ESWL 07/09/15 - postoperatively it did not appear there was significant fragmentation or passage of the stone.    Interval history: He was maintained on medical expulsive therapy as it appears his stone had not fragmented significantly or past and fortunately was not having any pain or hydronephrosis on ultrasound last month.    PSA in 3/16 was 0.03   Past Medical History Problems  1. History of hypercholesterolemia (Z86.39) 2. History of Morbid obesity (E66.01)  Surgical History Problems  1. History of Arthroscopy Knee 2. History of Cataract Surgery 3. History of Renal Lithotripsy  Current Meds 1. AmLODIPine Besylate 5 MG Oral Tablet;  Therapy: (Recorded:25Aug2016) to Recorded 2. Aspirin 325 MG Oral Tablet;  Therapy: (Recorded:25Aug2016) to Recorded 3. Fish Oil Concentrate 1000 MG Oral Capsule;  Therapy: (Recorded:25Aug2016) to Recorded 4. Latanoprost 0.005 % Ophthalmic Solution;  Therapy: (Recorded:25Aug2016) to Recorded 5. Lipitor 10 MG Oral Tablet;  Therapy: (Recorded:25Aug2016) to Recorded 6. Loratadine 10 MG Oral Tablet;  Therapy: (Recorded:25Aug2016) to Recorded 7. MetFORMIN HCl - 1000 MG Oral Tablet;  Therapy: (Recorded:25Aug2016) to Recorded 8. Multi-Vitamin TABS;  Therapy: (Recorded:25Aug2016) to Recorded 9. Tamsulosin HCl - 0.4 MG Oral Capsule; TAKE 1 CAPSULE Daily;  Therapy: [id number] to (Last Rx:07Sep2016)  Requested for: 07Sep2016 Ordered 10. Tamsulosin HCl - 0.4 MG Oral Capsule; TAKE 1 CAPSULE Daily;   Therapy: 29Sep2016 to (Evaluate:08Jan2017); Last Rx:09Nov2016 Ordered 11. Vitamin D 400 UNIT CAPS;   Therapy: (Recorded:25Aug2016) to Recorded  Allergies Medication  1. Codeine  Derivatives 2. Sulfa Drugs  Family History Problems  1. Family history of diabetes mellitus (Z83.3) : Mother, Sister 2. Family history of malignant neoplasm of breast (Z80.3) : Mother 3. Family history of Parkinson's disease (Z82.0) : Father  Social History Problems  1. Alcohol use (Z78.9)   maybe 1 drink per week 2. Former smoker [phone]) 3. Married  Review of Systems Genitourinary, constitutional, skin, eye, otolaryngeal, hematologic/lymphatic, cardiovascular, pulmonary, endocrine, musculoskeletal, gastrointestinal, neurological and psychiatric system(s) were reviewed and pertinent findings if present are noted and are otherwise negative.  Gastrointestinal: flank pain and heartburn.  ENT: sinus problems.  Musculoskeletal: back pain and joint pain.    Physical Exam Constitutional: Well nourished and well developed . No acute distress.   ENT:. The ears and nose are normal in appearance.   Neck: The appearance of the neck is normal and no neck mass is present.   Pulmonary: No respiratory distress and normal respiratory rhythm and effort.   Cardiovascular: Heart rate and rhythm are normal . No peripheral edema.   Abdomen: The abdomen is soft and nontender. No masses are palpated. No CVA tenderness. No hernias are palpable. No hepatosplenomegaly noted.   Lymphatics: The femoral and inguinal nodes are not enlarged or tender.   Skin: Normal skin turgor, no visible rash and no visible skin lesions.   Neuro/Psych:. Mood and affect are appropriate.    Vitals Vital Signs  Height: 5 ft 9 in Weight: 190 lb  BMI Calculated: 28.06 BSA Calculated: 2.02 Blood Pressure: 129 / 81 Heart Rate: 71  Results/Data Urine [ COLOR AMBER  APPEARANCE CLEAR  SPECIFIC GRAVITY 1.025  pH 5.5  GLUCOSE TRACE  BILIRUBIN NEGATIVE  KETONE  NEGATIVE  BLOOD 1+  PROTEIN NEGATIVE  NITRITE NEGATIVE  LEUKOCYTE ESTERASE NEGATIVE  SQUAMOUS EPITHELIAL/HPF 0-5 HPF WBC 0-5 WBC/HPF RBC 3-10  RBC/HPF BACTERIA NONE SEEN HPF CRYSTALS NONE SEEN HPF CASTS NONE SEEN LPF Yeast NONE SEEN HPF  The following images/tracing/specimen were independently visualized:  KUB: The calcification in his left ureter remains unchanged in size and position.  The following clinical lab reports were reviewed:  UA: Red cells were noted but his urine was otherwise negative.    Assessment   We discussed the management of urinary stones. These options include observation, ureteroscopy, shockwave lithotripsy, and PCNL. We discussed which options are relevant to these particular stones. We discussed the natural history of stones as well as the complications of untreated stones and the impact on quality of life without treatment as well as with each of the above listed treatments. We also discussed the efficacy of each treatment in its ability to clear the stone burden. With any of these management options I discussed the signs and symptoms of infection and the need for emergent treatment should these be experienced. For each option we discussed the ability of each procedure to clear the patient of their stone burden.    For observation I described the risks which include but are not limited to silent renal damage, life-threatening infection, need for emergent surgery, failure to pass stone, and pain.    For ureteroscopy I described the risks which include heart attack, stroke, pulmonary embolus, death, bleeding, infection, damage to contiguous structures, positioning injury, ureteral stricture, ureteral avulsion, ureteral injury, need for ureteral stent, inability to perform ureteroscopy, need for an interval procedure, inability to clear stone burden, stent discomfort and pain.    For shockwave lithotripsy I described the risks which include arrhythmia, kidney contusion, kidney hemorrhage, need for transfusion, long-term risk of diabetes or hypertension, back discomfort, flank ecchymosis, flank abrasion,  inability to break up stone, inability to pass stone fragments, Steinstrasse, infection associated with obstructing stones, need for different surgical procedure and possible need for repeat shockwave lithotripsy.    We discussed the concept of silent obstruction and the fact that his stone needs to be treated. Since he did not respond to lithotripsy previously I have not recommended repeating that but rather undergoing ureteroscopy.    Plan   He will be scheduled for left ureteroscopy and laser lithotripsy of his left ureteral stone.

## 2015-10-13 ENCOUNTER — Encounter (HOSPITAL_BASED_OUTPATIENT_CLINIC_OR_DEPARTMENT_OTHER): Payer: Self-pay | Admitting: Urology

## 2015-10-17 ENCOUNTER — Emergency Department (HOSPITAL_COMMUNITY): Payer: Medicare Other

## 2015-10-17 ENCOUNTER — Emergency Department (HOSPITAL_COMMUNITY)
Admission: EM | Admit: 2015-10-17 | Discharge: 2015-10-17 | Disposition: A | Discharge status: Home / Self Care | Payer: Medicare Other | Source: Home / Self Care | Attending: Emergency Medicine | Admitting: Emergency Medicine

## 2015-10-17 ENCOUNTER — Encounter (HOSPITAL_COMMUNITY): Payer: Self-pay | Admitting: Emergency Medicine

## 2015-10-17 DIAGNOSIS — I10 Essential (primary) hypertension: Secondary | ICD-10-CM | POA: Insufficient documentation

## 2015-10-17 DIAGNOSIS — H409 Unspecified glaucoma: Secondary | ICD-10-CM | POA: Insufficient documentation

## 2015-10-17 DIAGNOSIS — Z87442 Personal history of urinary calculi: Secondary | ICD-10-CM

## 2015-10-17 DIAGNOSIS — Z85820 Personal history of malignant melanoma of skin: Secondary | ICD-10-CM | POA: Insufficient documentation

## 2015-10-17 DIAGNOSIS — R109 Unspecified abdominal pain: Secondary | ICD-10-CM | POA: Insufficient documentation

## 2015-10-17 DIAGNOSIS — Z87891 Personal history of nicotine dependence: Secondary | ICD-10-CM

## 2015-10-17 DIAGNOSIS — Z79899 Other long term (current) drug therapy: Secondary | ICD-10-CM

## 2015-10-17 DIAGNOSIS — A419 Sepsis, unspecified organism: Secondary | ICD-10-CM | POA: Diagnosis not present

## 2015-10-17 DIAGNOSIS — E785 Hyperlipidemia, unspecified: Secondary | ICD-10-CM | POA: Insufficient documentation

## 2015-10-17 DIAGNOSIS — R509 Fever, unspecified: Secondary | ICD-10-CM

## 2015-10-17 DIAGNOSIS — E119 Type 2 diabetes mellitus without complications: Secondary | ICD-10-CM

## 2015-10-17 LAB — CBC WITH DIFFERENTIAL/PLATELET
BASOS ABS: 0 10*3/uL (ref 0.0–0.1)
Basophils Relative: 0 %
EOS PCT: 0 %
Eosinophils Absolute: 0 10*3/uL (ref 0.0–0.7)
HEMATOCRIT: 47.7 % (ref 39.0–52.0)
Hemoglobin: 16.4 g/dL (ref 13.0–17.0)
LYMPHS ABS: 1.6 10*3/uL (ref 0.7–4.0)
LYMPHS PCT: 9 %
MCH: 31.2 pg (ref 26.0–34.0)
MCHC: 34.4 g/dL (ref 30.0–36.0)
MCV: 90.7 fL (ref 78.0–100.0)
Monocytes Absolute: 2.2 10*3/uL — ABNORMAL HIGH (ref 0.1–1.0)
Monocytes Relative: 12 %
NEUTROS ABS: 13.9 10*3/uL — AB (ref 1.7–7.7)
Neutrophils Relative %: 79 %
Platelets: 268 10*3/uL (ref 150–400)
RBC: 5.26 MIL/uL (ref 4.22–5.81)
RDW: 12.7 % (ref 11.5–15.5)
WBC: 17.7 10*3/uL — AB (ref 4.0–10.5)

## 2015-10-17 LAB — URINALYSIS, ROUTINE W REFLEX MICROSCOPIC
Bilirubin Urine: NEGATIVE
Ketones, ur: NEGATIVE mg/dL
Nitrite: NEGATIVE
PH: 6 (ref 5.0–8.0)
PROTEIN: 30 mg/dL — AB
SPECIFIC GRAVITY, URINE: 1.02 (ref 1.005–1.030)

## 2015-10-17 LAB — URINE MICROSCOPIC-ADD ON

## 2015-10-17 LAB — COMPREHENSIVE METABOLIC PANEL
ALBUMIN: 4.2 g/dL (ref 3.5–5.0)
ALT: 26 U/L (ref 17–63)
ANION GAP: 11 (ref 5–15)
AST: 17 U/L (ref 15–41)
Alkaline Phosphatase: 57 U/L (ref 38–126)
BILIRUBIN TOTAL: 1.1 mg/dL (ref 0.3–1.2)
BUN: 16 mg/dL (ref 6–20)
CHLORIDE: 99 mmol/L — AB (ref 101–111)
CO2: 24 mmol/L (ref 22–32)
Calcium: 9.3 mg/dL (ref 8.9–10.3)
Creatinine, Ser: 1.21 mg/dL (ref 0.61–1.24)
GFR calc Af Amer: >60 mL/min (ref >60)
GLUCOSE: 212 mg/dL — AB (ref 65–99)
POTASSIUM: 4.2 mmol/L (ref 3.5–5.1)
Sodium: 134 mmol/L — ABNORMAL LOW (ref 135–145)
TOTAL PROTEIN: 7.2 g/dL (ref 6.5–8.1)

## 2015-10-17 MED ORDER — CEFTRIAXONE SODIUM 1 G IJ SOLR
1.0000 g | Freq: Once | INTRAMUSCULAR | Status: AC
  Administered 2015-10-17: 1 g via INTRAMUSCULAR
  Filled 2015-10-17: qty 10

## 2015-10-17 MED ORDER — CEPHALEXIN 500 MG PO CAPS: 500.0000 mg | ORAL_CAPSULE | Freq: Two times a day (BID) | ORAL | Status: DC

## 2015-10-17 MED ORDER — KETOROLAC TROMETHAMINE 30 MG/ML IJ SOLN: 30.0000 mg | Freq: Once | INTRAMUSCULAR | Status: DC

## 2015-10-17 MED ORDER — LIDOCAINE HCL (PF) 1 % IJ SOLN
INTRAMUSCULAR | Status: AC
  Filled 2015-10-17: qty 5

## 2015-10-17 MED ORDER — DEXTROSE 5 % IV SOLN: 1.0000 g | Freq: Once | INTRAVENOUS | Status: DC

## 2015-10-17 MED ORDER — KETOROLAC TROMETHAMINE 30 MG/ML IJ SOLN
30.0000 mg | Freq: Once | INTRAMUSCULAR | Status: AC
  Administered 2015-10-17: 30 mg via INTRAMUSCULAR
  Filled 2015-10-17: qty 1

## 2015-10-17 NOTE — ED Notes (Addendum)
Per pt, states he had kidney stones removed this week-states fever started last night-chills-states urinary frequency, and left flank pain-stent placed on left-states he went to [facility] and UA was negative-sent here for the fever

## 2015-10-17 NOTE — ED Provider Notes (Signed)
CSN: [id number]     Arrival date & time 10/17/15  1429 History   First MD Initiated Contact with Patient 10/17/15 1512     Chief Complaint  Patient presents with  . Fever     (Consider location/radiation/quality/duration/timing/severity/associated sxs/prior Treatment) HPI Comments: Patient presents to the ED with a chief complaint of fever.  Patient states that he had a renal stent placed on 5 days ago.  States that over the past 2 days he has had intermittent fevers, chills, and left flank pain.  States that he has also had urinary urgency and frequency, but denies any dysuria or hematuria.  He has been taking ibuprofen for fever control.  He states that he went to [facility] today and had a negative UA, and was referred to the ED for further evaluation.  The history is provided by the patient. No language interpreter was used.    Past Medical History  Diagnosis Date  . Hypertension   . Hyperlipidemia   . Type 2 diabetes mellitus ([place])   . History of melanoma excision     forearm 2010  . Left ureteral stone   . Glaucoma    Past Surgical History  Procedure Laterality Date  . Melanoma excision  2010    forearm  . Knee arthroscopy Bilateral 2000  . Cataract extraction w/ intraocular lens  implant, bilateral    . Colonoscopy  last one 10-07-2015  . Negative sleep study  YRS AGO per pt  . Cystoscopy with retrograde pyelogram, ureteroscopy and stent placement Left 10/12/2015    Procedure: LEFT RETROGRADE PYELOGRAM, LEFT URETEROSCOPY, LASER LITHOTRIPSY  AND STENT PLACEMENT;  Surgeon: Kathie Rhodes, MD;  Location: [place];  Service: Urology;  Laterality: Left;  . Holmium laser application Left 0000000    Procedure: HOLMIUM LASER APPLICATION;  Surgeon: Kathie Rhodes, MD;  Location: [facility];  Service: Urology;  Laterality: Left;   No family history on file. Social History  Substance Use Topics  . Smoking status: Former Smoker -- 20 years    Types:  Cigarettes    Quit date: 10/08/1987  . Smokeless tobacco: Never Used  . Alcohol Use: Yes     Comment: rare    Review of Systems  Constitutional: Positive for fever and chills.  Respiratory: Negative for shortness of breath.   Cardiovascular: Negative for chest pain.  Gastrointestinal: Negative for nausea, vomiting, diarrhea and constipation.  Genitourinary: Positive for flank pain. Negative for dysuria.  All other systems reviewed and are negative.     Allergies  Codeine; Oxycodone; and Sulfa antibiotics  Home Medications   Prior to Admission medications   Medication Sig Start Date End Date Taking? Authorizing Provider  amLODipine (NORVASC) 5 MG tablet Take 5 mg by mouth at bedtime.  04/12/15  Yes Historical Provider, MD  atorvastatin (LIPITOR) 10 MG tablet Take 5 mg by mouth every morning.  05/05/15  Yes Historical Provider, MD  Cholecalciferol (VITAMIN D3) 400 UNITS CAPS Take 400 Units by mouth daily.   Yes Historical Provider, MD  ibuprofen (ADVIL,MOTRIN) 200 MG tablet Take 200 mg by mouth daily as needed for headache, mild pain or moderate pain.   Yes Historical Provider, MD  latanoprost (XALATAN) 0.005 % ophthalmic solution Place 1 drop into both eyes every evening. 05/26/15  Yes Historical Provider, MD  lisinopril-hydrochlorothiazide (PRINZIDE,ZESTORETIC) 20-12.5 MG per tablet Take 1 tablet by mouth every morning.  04/06/15  Yes Historical Provider, MD  loratadine (CLARITIN) 10 MG tablet Take 10 mg  by mouth daily.   Yes Historical Provider, MD  metFORMIN (GLUCOPHAGE) 1000 MG tablet Take 1,000 mg by mouth 2 (two) times daily. 05/11/15  Yes Historical Provider, MD  Multiple Vitamins-Minerals (MULTIVITAMIN ADULT PO) Take 1 tablet by mouth daily.   Yes Historical Provider, MD  Omega-3 Fatty Acids (FISH OIL) 1200 MG CAPS Take 1,200 mg by mouth 2 (two) times daily.   Yes Historical Provider, MD  traMADol (ULTRAM) 50 MG tablet Take 1 tablet (50 mg total) by mouth every 6 (six) hours as  needed. Patient taking differently: Take 50 mg by mouth every 6 (six) hours as needed for moderate pain or severe pain.  10/12/15  Yes Kathie Rhodes, MD  phenazopyridine (PYRIDIUM) 200 MG tablet Take 1 tablet (200 mg total) by mouth 3 (three) times daily as needed for pain. Patient not taking: Reported on 10/17/2015 10/12/15   Kathie Rhodes, MD  tamsulosin (FLOMAX) 0.4 MG CAPS capsule Take 1 capsule (0.4 mg total) by mouth daily. Patient not taking: Reported on 10/17/2015 06/03/15   Courteney Lyn Mackuen, MD   BP 158/98 mmHg  Pulse 99  Temp(Src) 98.7 F (37.1 C) (Oral)  Resp 18  SpO2 96% Physical Exam  Constitutional: He is oriented to person, place, and time. He appears well-developed and well-nourished.  HENT:  Head: Normocephalic and atraumatic.  Eyes: Conjunctivae and EOM are normal. Pupils are equal, round, and reactive to light. Right eye exhibits no discharge. Left eye exhibits no discharge. No scleral icterus.  Neck: Normal range of motion. Neck supple. No JVD present.  Cardiovascular: Normal rate, regular rhythm and normal heart sounds.  Exam reveals no gallop and no friction rub.   No murmur heard. Pulmonary/Chest: Effort normal and breath sounds normal. No respiratory distress. He has no wheezes. He has no rales. He exhibits no tenderness.  Abdominal: Soft. He exhibits no distension and no mass. There is no tenderness. There is no rebound and no guarding.  No focal abdominal tenderness, no RLQ tenderness or pain at McBurney's point, no RUQ tenderness or Murphy's sign, no left-sided abdominal tenderness, no fluid wave, or signs of peritonitis   Musculoskeletal: Normal range of motion. He exhibits no edema or tenderness.  Neurological: He is alert and oriented to person, place, and time.  Skin: Skin is warm and dry.  Psychiatric: He has a normal mood and affect. His behavior is normal. Judgment and thought content normal.  Nursing note and vitals reviewed.   ED Course   Procedures (including critical care time) Results for orders placed or performed during the hospital encounter of 10/17/15  Comprehensive metabolic panel  Result Value Ref Range   Sodium 134 (L) 135 - 145 mmol/L   Potassium 4.2 3.5 - 5.1 mmol/L   Chloride 99 (L) 101 - 111 mmol/L   CO2 24 22 - 32 mmol/L   Glucose, Bld 212 (H) 65 - 99 mg/dL   BUN 16 6 - 20 mg/dL   Creatinine, Ser 1.21 0.61 - 1.24 mg/dL   Calcium 9.3 8.9 - 10.3 mg/dL   Total Protein 7.2 6.5 - 8.1 g/dL   Albumin 4.2 3.5 - 5.0 g/dL   AST 17 15 - 41 U/L   ALT 26 17 - 63 U/L   Alkaline Phosphatase 57 38 - 126 U/L   Total Bilirubin 1.1 0.3 - 1.2 mg/dL   GFR calc non Af Amer >60 >60 mL/min   GFR calc Af Amer >60 >60 mL/min   Anion gap 11 5 - 15  CBC with Differential  Result Value Ref Range   WBC 17.7 (H) 4.0 - 10.5 K/uL   RBC 5.26 4.22 - 5.81 MIL/uL   Hemoglobin 16.4 13.0 - 17.0 g/dL   HCT 47.7 39.0 - 52.0 %   MCV 90.7 78.0 - 100.0 fL   MCH 31.2 26.0 - 34.0 pg   MCHC 34.4 30.0 - 36.0 g/dL   RDW 12.7 11.5 - 15.5 %   Platelets 268 150 - 400 K/uL   Neutrophils Relative % 79 %   Neutro Abs 13.9 (H) 1.7 - 7.7 K/uL   Lymphocytes Relative 9 %   Lymphs Abs 1.6 0.7 - 4.0 K/uL   Monocytes Relative 12 %   Monocytes Absolute 2.2 (H) 0.1 - 1.0 K/uL   Eosinophils Relative 0 %   Eosinophils Absolute 0.0 0.0 - 0.7 K/uL   Basophils Relative 0 %   Basophils Absolute 0.0 0.0 - 0.1 K/uL  Urinalysis, Routine w reflex microscopic (not at [facility])  Result Value Ref Range   Color, Urine YELLOW YELLOW   APPearance CLEAR CLEAR   Specific Gravity, Urine 1.020 1.005 - 1.030   pH 6.0 5.0 - 8.0   Glucose, UA >1000 (A) NEGATIVE mg/dL   Hgb urine dipstick MODERATE (A) NEGATIVE   Bilirubin Urine NEGATIVE NEGATIVE   Ketones, ur NEGATIVE NEGATIVE mg/dL   Protein, ur 30 (A) NEGATIVE mg/dL   Nitrite NEGATIVE NEGATIVE   Leukocytes, UA SMALL (A) NEGATIVE  Urine microscopic-add on  Result Value Ref Range   Squamous Epithelial / LPF 0-5 (A)  NONE SEEN   WBC, UA 6-30 0 - 5 WBC/hpf   RBC / HPF 6-30 0 - 5 RBC/hpf   Bacteria, UA FEW (A) NONE SEEN   Dg Abd 1 View  10/17/2015  CLINICAL DATA:  Left flank pain.  Recent stent placement. EXAM: ABDOMEN - 1 VIEW COMPARISON:  Radiographs 10/12/2015. FINDINGS: Double-J left ureteral stent appears well positioned. No definite stone fragments are seen along the course of the stent. The visualized bowel gas pattern is normal. Mild lumbar spine degenerative changes are noted. IMPRESSION: Satisfactory position of left ureteral stent. No stone fragments seen along the course of the stent. Electronically Signed   By: Richardean Sale M.D.   On: 10/17/2015 16:58   [facility] Day Of Procedure  10/12/2015  CLINICAL DATA:  Pre-op left ureteral stone EXAM: ABDOMEN - 1 VIEW COMPARISON:  Plain film of 10/06/2015. Most recent CT of 06/03/2015. FINDINGS: Non-obstructive bowel gas pattern. 7 mm calcification projecting over the proximal to mid left ureter, likely corresponding to the ureteric stone described on the prior exam. No new calcifications over the kidneys or expected course of the ureters. IMPRESSION: 7 mm left ureteric calculus. Electronically Signed   By: Abigail Miyamoto M.D.   On: 10/12/2015 08:12    I have personally reviewed and evaluated these images and lab results as part of my medical decision-making.    MDM   Final diagnoses:  Flank pain    Patient with intermittent left flank pain, intermittent fevers and chills.  Will check UA and urine culture.  Symptoms certainly seem consistent with UTI.  Patient has not had any other symptoms to explain his fever.  He has a leukocytosis of 17.7.  Patient discussed with Dr. Gaynelle Arabian from urology.  Recommends KUB to see stent placement and repeat UA.    Stent is in place.  UA shows white and red cells.  Possible developing UTI.  Will treat with rocephin and toradol.  Discussed with Dr. Gaynelle Arabian who agrees with this plan.   DC to home with keflex and f/u  with Dr. Karsten Ro.  Return precautions given.  Patient is stable and ready for discharge.   Montine Circle, PA-C 10/17/15 1817  Sherwood Gambler, MD 10/24/15 [phone]

## 2015-10-17 NOTE — Discharge Instructions (Signed)
Flank Pain °Flank pain refers to pain that is located on the side of the body between the upper abdomen and the back. The pain may occur over a short period of time (acute) or may be long-term or reoccurring (chronic). It may be mild or severe. Flank pain can be caused by many things. °CAUSES  °Some of the more common causes of flank pain include: °· Muscle strains.   °· Muscle spasms.   °· A disease of your spine (vertebral disk disease).   °· A lung infection (pneumonia).   °· Fluid around your lungs (pulmonary edema).   °· A kidney infection.   °· Kidney stones.   °· A very painful skin rash caused by the chickenpox virus (shingles).   °· Gallbladder disease.   °HOME CARE INSTRUCTIONS  °Home care will depend on the cause of your pain. In general, °· Rest as directed by your caregiver. °· Drink enough fluids to keep your urine clear or pale yellow. °· Only take over-the-counter or prescription medicines as directed by your caregiver. Some medicines may help relieve the pain. °· Tell your caregiver about any changes in your pain. °· Follow up with your caregiver as directed. °SEEK IMMEDIATE MEDICAL CARE IF:  °· Your pain is not controlled with medicine.   °· You have new or worsening symptoms. °· Your pain increases.   °· You have abdominal pain.   °· You have shortness of breath.   °· You have persistent nausea or vomiting.   °· You have swelling in your abdomen.   °· You feel faint or pass out.   °· You have blood in your urine. °· You have a fever or persistent symptoms for more than 2-3 days. °· You have a fever and your symptoms suddenly get worse. °MAKE SURE YOU:  °· Understand these instructions. °· Will watch your condition. °· Will get help right away if you are not doing well or get worse. °  °This information is not intended to replace advice given to you by your health care provider. Make sure you discuss any questions you have with your health care provider. °  °Document Released: 12/01/2005 Document  Revised: 07/04/2012 Document Reviewed: 05/24/2012 °Elsevier Interactive Patient Education ©2016 [employer] Inc. ° °

## 2015-10-18 ENCOUNTER — Encounter (HOSPITAL_COMMUNITY): Payer: Self-pay | Admitting: Emergency Medicine

## 2015-10-18 ENCOUNTER — Emergency Department (HOSPITAL_COMMUNITY): Payer: Medicare Other

## 2015-10-18 ENCOUNTER — Inpatient Hospital Stay (HOSPITAL_COMMUNITY)
Admission: EM | Admit: 2015-10-18 | Discharge: 2015-10-22 | DRG: 872 | Disposition: A | Discharge status: Home / Self Care | Payer: Medicare Other | Attending: Internal Medicine | Admitting: Internal Medicine

## 2015-10-18 DIAGNOSIS — I10 Essential (primary) hypertension: Secondary | ICD-10-CM | POA: Diagnosis not present

## 2015-10-18 DIAGNOSIS — A419 Sepsis, unspecified organism: Secondary | ICD-10-CM | POA: Diagnosis not present

## 2015-10-18 DIAGNOSIS — A4181 Sepsis due to Enterococcus: Secondary | ICD-10-CM | POA: Diagnosis present

## 2015-10-18 DIAGNOSIS — E872 Acidosis: Secondary | ICD-10-CM | POA: Diagnosis present

## 2015-10-18 DIAGNOSIS — B952 Enterococcus as the cause of diseases classified elsewhere: Secondary | ICD-10-CM | POA: Diagnosis present

## 2015-10-18 DIAGNOSIS — Z9842 Cataract extraction status, left eye: Secondary | ICD-10-CM

## 2015-10-18 DIAGNOSIS — N390 Urinary tract infection, site not specified: Secondary | ICD-10-CM | POA: Diagnosis not present

## 2015-10-18 DIAGNOSIS — H409 Unspecified glaucoma: Secondary | ICD-10-CM | POA: Diagnosis present

## 2015-10-18 DIAGNOSIS — Z9841 Cataract extraction status, right eye: Secondary | ICD-10-CM

## 2015-10-18 DIAGNOSIS — E119 Type 2 diabetes mellitus without complications: Secondary | ICD-10-CM | POA: Diagnosis not present

## 2015-10-18 DIAGNOSIS — Z87891 Personal history of nicotine dependence: Secondary | ICD-10-CM

## 2015-10-18 DIAGNOSIS — Z87442 Personal history of urinary calculi: Secondary | ICD-10-CM

## 2015-10-18 DIAGNOSIS — Z79899 Other long term (current) drug therapy: Secondary | ICD-10-CM

## 2015-10-18 DIAGNOSIS — Z961 Presence of intraocular lens: Secondary | ICD-10-CM | POA: Diagnosis present

## 2015-10-18 DIAGNOSIS — D72829 Elevated white blood cell count, unspecified: Secondary | ICD-10-CM | POA: Diagnosis present

## 2015-10-18 DIAGNOSIS — E785 Hyperlipidemia, unspecified: Secondary | ICD-10-CM | POA: Diagnosis present

## 2015-10-18 DIAGNOSIS — E1169 Type 2 diabetes mellitus with other specified complication: Secondary | ICD-10-CM | POA: Diagnosis present

## 2015-10-18 LAB — GLUCOSE, CAPILLARY
GLUCOSE-CAPILLARY: 252 mg/dL — AB (ref 65–99)
Glucose-Capillary: 218 mg/dL — ABNORMAL HIGH (ref 65–99)

## 2015-10-18 LAB — CBC WITH DIFFERENTIAL/PLATELET
BASOS PCT: 0 %
Basophils Absolute: 0 10*3/uL (ref 0.0–0.1)
EOS ABS: 0 10*3/uL (ref 0.0–0.7)
Eosinophils Relative: 0 %
HEMATOCRIT: 45.2 % (ref 39.0–52.0)
HEMOGLOBIN: 15.7 g/dL (ref 13.0–17.0)
LYMPHS PCT: 4 %
Lymphs Abs: 1.1 10*3/uL (ref 0.7–4.0)
MCH: 30.6 pg (ref 26.0–34.0)
MCHC: 34.7 g/dL (ref 30.0–36.0)
MCV: 88.1 fL (ref 78.0–100.0)
MONOS PCT: 13 %
Monocytes Absolute: 3.5 10*3/uL — ABNORMAL HIGH (ref 0.1–1.0)
NEUTROS ABS: 22.3 10*3/uL — AB (ref 1.7–7.7)
NEUTROS PCT: 83 %
Platelets: 197 10*3/uL (ref 150–400)
RBC: 5.13 MIL/uL (ref 4.22–5.81)
RDW: 12.6 % (ref 11.5–15.5)
WBC: 26.9 10*3/uL — ABNORMAL HIGH (ref 4.0–10.5)

## 2015-10-18 LAB — COMPREHENSIVE METABOLIC PANEL
ALBUMIN: 3.8 g/dL (ref 3.5–5.0)
ALK PHOS: 57 U/L (ref 38–126)
ALT: 23 U/L (ref 17–63)
AST: 18 U/L (ref 15–41)
Anion gap: 11 (ref 5–15)
BILIRUBIN TOTAL: 1.6 mg/dL — AB (ref 0.3–1.2)
BUN: 17 mg/dL (ref 6–20)
CO2: 23 mmol/L (ref 22–32)
Calcium: 9.4 mg/dL (ref 8.9–10.3)
Chloride: 99 mmol/L — ABNORMAL LOW (ref 101–111)
Creatinine, Ser: 1.15 mg/dL (ref 0.61–1.24)
GFR calc non Af Amer: >60 mL/min (ref >60)
GLUCOSE: 237 mg/dL — AB (ref 65–99)
Potassium: 3.7 mmol/L (ref 3.5–5.1)
SODIUM: 133 mmol/L — AB (ref 135–145)
TOTAL PROTEIN: 6.6 g/dL (ref 6.5–8.1)

## 2015-10-18 LAB — URINALYSIS, ROUTINE W REFLEX MICROSCOPIC
Bilirubin Urine: NEGATIVE
Glucose, UA: >1000 mg/dL — AB
Ketones, ur: 40 mg/dL — AB
NITRITE: NEGATIVE
PH: 7 (ref 5.0–8.0)
Protein, ur: 100 mg/dL — AB
SPECIFIC GRAVITY, URINE: 1.028 (ref 1.005–1.030)

## 2015-10-18 LAB — URINE MICROSCOPIC-ADD ON

## 2015-10-18 LAB — PROTIME-INR
INR: 1.18 (ref 0.00–1.49)
PROTHROMBIN TIME: 15.2 s (ref 11.6–15.2)

## 2015-10-18 LAB — I-STAT CG4 LACTIC ACID, ED
LACTIC ACID, VENOUS: 1.89 mmol/L (ref 0.5–2.0)
Lactic Acid, Venous: 2.45 mmol/L (ref 0.5–2.0)

## 2015-10-18 LAB — TSH: TSH: 0.155 u[IU]/mL — AB (ref 0.350–4.500)

## 2015-10-18 MED ORDER — INSULIN ASPART 100 UNIT/ML ~~LOC~~ SOLN
0.0000 [IU] | Freq: Three times a day (TID) | SUBCUTANEOUS | Status: DC
  Administered 2015-10-18: 5 [IU] via SUBCUTANEOUS

## 2015-10-18 MED ORDER — ACETAMINOPHEN 650 MG RE SUPP: 650.0000 mg | Freq: Four times a day (QID) | RECTAL | Status: DC | PRN

## 2015-10-18 MED ORDER — SODIUM CHLORIDE 0.9 % IV SOLN
INTRAVENOUS | Status: DC
  Administered 2015-10-18: 14:00:00 via INTRAVENOUS
  Administered 2015-10-19: 1000 mL via INTRAVENOUS
  Administered 2015-10-19 – 2015-10-22 (×2): via INTRAVENOUS

## 2015-10-18 MED ORDER — SODIUM CHLORIDE 0.9 % IV BOLUS (SEPSIS)
1000.0000 mL | Freq: Once | INTRAVENOUS | Status: AC
  Administered 2015-10-18: 1000 mL via INTRAVENOUS

## 2015-10-18 MED ORDER — MORPHINE SULFATE (PF) 2 MG/ML IV SOLN: 1.0000 mg | INTRAVENOUS | Status: DC | PRN

## 2015-10-18 MED ORDER — ACETAMINOPHEN 500 MG PO TABS: 1000.0000 mg | ORAL_TABLET | Freq: Four times a day (QID) | ORAL | Status: DC | PRN

## 2015-10-18 MED ORDER — ONDANSETRON HCL 4 MG PO TABS: 4.0000 mg | ORAL_TABLET | Freq: Four times a day (QID) | ORAL | Status: DC | PRN

## 2015-10-18 MED ORDER — INSULIN ASPART 100 UNIT/ML ~~LOC~~ SOLN
0.0000 [IU] | Freq: Three times a day (TID) | SUBCUTANEOUS | Status: DC
  Administered 2015-10-19: 2 [IU] via SUBCUTANEOUS
  Administered 2015-10-19 (×2): 3 [IU] via SUBCUTANEOUS
  Administered 2015-10-20 (×2): 2 [IU] via SUBCUTANEOUS
  Administered 2015-10-20: 3 [IU] via SUBCUTANEOUS
  Administered 2015-10-21: 2 [IU] via SUBCUTANEOUS
  Administered 2015-10-22: 1 [IU] via SUBCUTANEOUS

## 2015-10-18 MED ORDER — LATANOPROST 0.005 % OP SOLN
1.0000 [drp] | Freq: Every evening | OPHTHALMIC | Status: DC
  Administered 2015-10-19 – 2015-10-21 (×3): 1 [drp] via OPHTHALMIC
  Filled 2015-10-18: qty 2.5

## 2015-10-18 MED ORDER — KETOROLAC TROMETHAMINE 30 MG/ML IJ SOLN
30.0000 mg | Freq: Once | INTRAMUSCULAR | Status: AC
  Administered 2015-10-18: 30 mg via INTRAVENOUS
  Filled 2015-10-18: qty 1

## 2015-10-18 MED ORDER — LORATADINE 10 MG PO TABS
10.0000 mg | ORAL_TABLET | Freq: Every day | ORAL | Status: DC
  Administered 2015-10-19 – 2015-10-21 (×3): 10 mg via ORAL
  Filled 2015-10-18 (×5): qty 1

## 2015-10-18 MED ORDER — CHOLECALCIFEROL 10 MCG (400 UNIT) PO TABS
400.0000 [IU] | ORAL_TABLET | Freq: Every day | ORAL | Status: DC
  Administered 2015-10-19 – 2015-10-21 (×3): 400 [IU] via ORAL
  Filled 2015-10-18 (×5): qty 1

## 2015-10-18 MED ORDER — ONDANSETRON HCL 4 MG/2ML IJ SOLN
4.0000 mg | Freq: Four times a day (QID) | INTRAMUSCULAR | Status: DC | PRN
  Administered 2015-10-18 – 2015-10-20 (×3): 4 mg via INTRAVENOUS
  Filled 2015-10-18 (×3): qty 2

## 2015-10-18 MED ORDER — HEPARIN SODIUM (PORCINE) 5000 UNIT/ML IJ SOLN
5000.0000 [IU] | Freq: Three times a day (TID) | INTRAMUSCULAR | Status: DC
  Administered 2015-10-18 – 2015-10-21 (×8): 5000 [IU] via SUBCUTANEOUS
  Filled 2015-10-18 (×9): qty 1

## 2015-10-18 MED ORDER — VANCOMYCIN HCL IN DEXTROSE 1-5 GM/200ML-% IV SOLN
1000.0000 mg | Freq: Once | INTRAVENOUS | Status: AC
  Administered 2015-10-18: 1000 mg via INTRAVENOUS
  Filled 2015-10-18: qty 200

## 2015-10-18 MED ORDER — AMLODIPINE BESYLATE 5 MG PO TABS
5.0000 mg | ORAL_TABLET | Freq: Every day | ORAL | Status: DC
  Administered 2015-10-18 – 2015-10-21 (×4): 5 mg via ORAL
  Filled 2015-10-18 (×4): qty 1

## 2015-10-18 MED ORDER — DEXTROSE 5 % IV SOLN
2.0000 g | Freq: Once | INTRAVENOUS | Status: AC
  Administered 2015-10-18: 2 g via INTRAVENOUS
  Filled 2015-10-18: qty 2

## 2015-10-18 MED ORDER — HYDROCODONE-ACETAMINOPHEN 5-325 MG PO TABS: 1.0000 | ORAL_TABLET | ORAL | Status: DC | PRN

## 2015-10-18 MED ORDER — INSULIN ASPART 100 UNIT/ML ~~LOC~~ SOLN
0.0000 [IU] | Freq: Every day | SUBCUTANEOUS | Status: DC
  Administered 2015-10-18 – 2015-10-19 (×2): 2 [IU] via SUBCUTANEOUS
  Administered 2015-10-20: 3 [IU] via SUBCUTANEOUS
  Administered 2015-10-21: 2 [IU] via SUBCUTANEOUS

## 2015-10-18 MED ORDER — ATORVASTATIN CALCIUM 10 MG PO TABS
5.0000 mg | ORAL_TABLET | Freq: Every morning | ORAL | Status: DC
  Administered 2015-10-19 – 2015-10-22 (×4): 5 mg via ORAL
  Filled 2015-10-18 (×5): qty 1

## 2015-10-18 MED ORDER — VITAMIN D3 10 MCG (400 UNIT) PO CAPS: 400.0000 [IU] | ORAL_CAPSULE | Freq: Every day | ORAL | Status: DC

## 2015-10-18 MED ORDER — ACETAMINOPHEN 325 MG PO TABS
650.0000 mg | ORAL_TABLET | Freq: Four times a day (QID) | ORAL | Status: DC | PRN
  Administered 2015-10-18 (×2): 650 mg via ORAL
  Filled 2015-10-18 (×3): qty 2

## 2015-10-18 MED ORDER — PIPERACILLIN-TAZOBACTAM 3.375 G IVPB
3.3750 g | Freq: Three times a day (TID) | INTRAVENOUS | Status: DC
  Administered 2015-10-18 – 2015-10-22 (×12): 3.375 g via INTRAVENOUS
  Filled 2015-10-18 (×11): qty 50

## 2015-10-18 NOTE — H&P (Signed)
[facility] History and Physical  ARDELL Morrow BLT:[id number] DOB: May 24, 1946 DOA: 10/18/2015  Referring physician: EDP PCP: Tivis Ringer, MD   Chief Complaint: UTI  HPI: Brandon Morrow is a 69 y.o. male with past medical history of diabetes mellitus type 2, hypertension and dyslipidemia came to the hospital because of fever and chills. Patient has had left-sided ureteric stent placed on past Monday, patient developed fever and chills yesterday he came into the emergency department, given shot of Rocephin and discharged home on Keflex. Patient reported he continues to have fever up to 103, and he vomited twice this morning so he came into the hospital for further evaluation.  in the ED urine consistent with UTI, lactic acid slightly elevated at 2.45. Patient admitted to the hospital for further evaluation.  Review of Systems:  Constitutional: Has fevers and sweats Eyes: negative for irritation, redness and visual disturbance Ears, nose, mouth, throat, and face: negative for earaches, epistaxis, nasal congestion and sore throat Respiratory: negative for cough, dyspnea on exertion, sputum and wheezing Cardiovascular: negative for chest pain, dyspnea, lower extremity edema, orthopnea, palpitations and syncope Gastrointestinal: negative for abdominal pain, constipation, diarrhea, melena, nausea and vomiting Genitourinary:negative for dysuria, frequency and hematuria Hematologic/lymphatic: negative for bleeding, easy bruising and lymphadenopathy Musculoskeletal:negative for arthralgias, muscle weakness and stiff joints Neurological: negative for coordination problems, gait problems, headaches and weakness Endocrine: negative for diabetic symptoms including polydipsia, polyuria and weight loss Allergic/Immunologic: negative for anaphylaxis, hay fever and urticaria  Past Medical History  Diagnosis Date  . Hypertension   . Hyperlipidemia   . Type 2 diabetes mellitus ([place])   .  History of melanoma excision     forearm 2010  . Left ureteral stone   . Glaucoma    Past Surgical History  Procedure Laterality Date  . Melanoma excision  2010    forearm  . Knee arthroscopy Bilateral 2000  . Cataract extraction w/ intraocular lens  implant, bilateral    . Colonoscopy  last one 10-07-2015  . Negative sleep study  YRS AGO per pt  . Cystoscopy with retrograde pyelogram, ureteroscopy and stent placement Left 10/12/2015    Procedure: LEFT RETROGRADE PYELOGRAM, LEFT URETEROSCOPY, LASER LITHOTRIPSY  AND STENT PLACEMENT;  Surgeon: Kathie Rhodes, MD;  Location: [place];  Service: Urology;  Laterality: Left;  . Holmium laser application Left 00/76/2263    Procedure: HOLMIUM LASER APPLICATION;  Surgeon: Kathie Rhodes, MD;  Location: [facility];  Service: Urology;  Laterality: Left;   Social History:   reports that he quit smoking about 28 years ago. His smoking use included Cigarettes. He quit after 20 years of use. He has never used smokeless tobacco. He reports that he drinks alcohol. He reports that he does not use illicit drugs.  Allergies  Allergen Reactions  . Codeine Itching  . Oxycodone Itching  . Sulfa Antibiotics Rash    Family history: No family history of kidney stones  Prior to Admission medications   Medication Sig Start Date End Date Taking? Authorizing Provider  acetaminophen (TYLENOL) 500 MG tablet Take 1,000 mg by mouth every 6 (six) hours as needed for moderate pain.   Yes Historical Provider, MD  amLODipine (NORVASC) 5 MG tablet Take 5 mg by mouth at bedtime.  04/12/15  Yes Historical Provider, MD  atorvastatin (LIPITOR) 10 MG tablet Take 5 mg by mouth every morning.  05/05/15  Yes Historical Provider, MD  cephALEXin (KEFLEX) 500 MG capsule Take 1 capsule (500 mg total)  by mouth 2 (two) times daily. 10/17/15  Yes Montine Circle, PA-C  Cholecalciferol (VITAMIN D3) 400 UNITS CAPS Take 400 Units by mouth daily.   Yes  Historical Provider, MD  ibuprofen (ADVIL,MOTRIN) 200 MG tablet Take 400 mg by mouth daily as needed for headache, mild pain or moderate pain.    Yes Historical Provider, MD  latanoprost (XALATAN) 0.005 % ophthalmic solution Place 1 drop into both eyes every evening. 05/26/15  Yes Historical Provider, MD  lisinopril-hydrochlorothiazide (PRINZIDE,ZESTORETIC) 20-12.5 MG per tablet Take 1 tablet by mouth every morning.  04/06/15  Yes Historical Provider, MD  loratadine (CLARITIN) 10 MG tablet Take 10 mg by mouth daily.   Yes Historical Provider, MD  metFORMIN (GLUCOPHAGE) 1000 MG tablet Take 1,000 mg by mouth 2 (two) times daily. 05/11/15  Yes Historical Provider, MD  Multiple Vitamins-Minerals (MULTIVITAMIN ADULT PO) Take 1 tablet by mouth daily.   Yes Historical Provider, MD  Omega-3 Fatty Acids (FISH OIL) 1200 MG CAPS Take 1,200 mg by mouth 2 (two) times daily.   Yes Historical Provider, MD   Physical Exam: Filed Vitals:   10/18/15 0957 10/18/15 1151  BP: 125/79 117/72  Pulse: 96 91  Temp: 99.3 F (37.4 C) 98.2 F (36.8 C)  Resp: 18 16   Constitutional: Oriented to person, place, and time. Well-developed and well-nourished. Cooperative.  Head: Normocephalic and atraumatic.  Nose: Nose normal.  Mouth/Throat: Uvula is midline, oropharynx is clear and moist and mucous membranes are normal.  Eyes: Conjunctivae and EOM are normal. Pupils are equal, round, and reactive to light.  Neck: Trachea normal and normal range of motion. Neck supple.  Cardiovascular: Normal rate, regular rhythm, S1 normal, S2 normal, normal heart sounds and intact distal pulses.   Pulmonary/Chest: Effort normal and breath sounds normal.  Abdominal: Soft. Bowel sounds are normal. There is no hepatosplenomegaly. There is no tenderness.  Musculoskeletal: Normal range of motion.  Neurological: Alert and oriented to person, place, and time. Has normal strength. No cranial nerve deficit or sensory deficit.  Skin: Skin is warm,  dry and intact.  Psychiatric: Has a normal mood and affect. Speech is normal and behavior is normal.   Labs on Admission:  Basic Metabolic Panel:  Recent Labs Lab 10/12/15 0844 10/17/15 1444 10/18/15 0943  NA 139 134* 133*  K 3.7 4.2 3.7  CL  --  99* 99*  CO2  --  24 23  GLUCOSE 165* 212* 237*  BUN  --  16 17  CREATININE  --  1.21 1.15  CALCIUM  --  9.3 9.4   Liver Function Tests:  Recent Labs Lab 10/17/15 1444 10/18/15 0943  AST 17 18  ALT 26 23  ALKPHOS 57 57  BILITOT 1.1 1.6*  PROT 7.2 6.6  ALBUMIN 4.2 3.8   No results for input(s): LIPASE, AMYLASE in the last 168 hours. No results for input(s): AMMONIA in the last 168 hours. CBC:  Recent Labs Lab 10/12/15 0844 10/17/15 1444 10/18/15 0943  WBC  --  17.7* 26.9*  NEUTROABS  --  13.9* 22.3*  HGB 15.3 16.4 15.7  HCT 45.0 47.7 45.2  MCV  --  90.7 88.1  PLT  --  268 197   Cardiac Enzymes: No results for input(s): CKTOTAL, CKMB, CKMBINDEX, TROPONINI in the last 168 hours.  BNP (last 3 results) No results for input(s): BNP in the last 8760 hours.  ProBNP (last 3 results) No results for input(s): PROBNP in the last 8760 hours.  CBG:  Recent  Labs Lab 10/12/15 1036  GLUCAP 152*    Radiological Exams on Admission: Ct Abdomen Pelvis Wo Contrast  10/18/2015  CLINICAL DATA:  Per pt, states he had kidney stones removed this week-states fever started last night-chills-states urinary frequency, and left flank pain-stent placed on left-states he went to UC and UA was negative-sent here for the fever Hx of diabetes EXAM: CT ABDOMEN AND PELVIS WITHOUT CONTRAST TECHNIQUE: Multidetector CT imaging of the abdomen and pelvis was performed following the standard protocol without IV contrast. COMPARISON:  06/03/2015 FINDINGS: Moderate coronary calcifications. Linear scarring or subsegmental atelectasis in the visualized lung bases right greater than left. Unremarkable liver, nondilated gallbladder, spleen, adrenal glands,  pancreas, and kidneys. Unenhanced CT was performed per clinician order. Lack of IV contrast limits sensitivity and specificity, especially for evaluation of abdominal/pelvic solid viscera. Left double-J ureteral stent in expected location. No hydronephrosis. No nephrolithiasis. Urinary bladder physiologically distended. Mild prostatic prominence. Small hiatal hernia. Stomach, small bowel, and colon are nondilated. Appendix not identified. Patchy aortoiliac arterial plaque without aneurysm. No free air.  No ascites.  No adenopathy. Multilevel spondylitic changes in the lumbar spine. Bilateral hip DJD. IMPRESSION: 1. No acute abdominal process. 2. Stable position of left ureteral stent, without hydronephrosis or nephrolithiasis. 3. Atherosclerosis, including aortoiliac and coronary artery disease. Please note that although the presence of coronary artery calcium documents the presence of coronary artery disease, the severity of this disease and any potential stenosis cannot be assessed on this non-gated CT examination. Assessment for potential risk factor modification, dietary therapy or pharmacologic therapy may be warranted, if clinically indicated. Electronically Signed   By: Lucrezia Europe M.D.   On: 10/18/2015 09:39   Dg Chest 2 View  10/18/2015  CLINICAL DATA:  69 year old male with fever, chills and vomiting. Recent placement of the day double-J ureteral stent EXAM: CHEST  2 VIEW COMPARISON:  Fluoroscopic images 1219 2016; prior chest x-ray 06/03/2015 FINDINGS: Focal patchy airspace opacity in the lingula appears linear in nature and is favored to reflect atelectasis. Otherwise, the lungs are clear. No pleural effusion or pneumothorax. Cardiac and mediastinal contours are within normal limits. No acute fracture or lytic or blastic osseous lesions. The visualized upper abdominal bowel gas pattern is unremarkable. IMPRESSION: 1. Lingular subsegmental atelectasis. 2. Otherwise, no acute cardiopulmonary process.  Electronically Signed   By: Jacqulynn Cadet M.D.   On: 10/18/2015 09:31   Dg Abd 1 View  10/17/2015  CLINICAL DATA:  Left flank pain.  Recent stent placement. EXAM: ABDOMEN - 1 VIEW COMPARISON:  Radiographs 10/12/2015. FINDINGS: Double-J left ureteral stent appears well positioned. No definite stone fragments are seen along the course of the stent. The visualized bowel gas pattern is normal. Mild lumbar spine degenerative changes are noted. IMPRESSION: Satisfactory position of left ureteral stent. No stone fragments seen along the course of the stent. Electronically Signed   By: Richardean Sale M.D.   On: 10/17/2015 16:58    EKG: Independently reviewed. Pending  Assessment/Plan Principal Problem:   Sepsis ([place]) Active Problems:   UTI (lower urinary tract infection)   Diabetes mellitus type 2, controlled, without complications (HCC)   HTN (hypertension)    Sepsis Met sepsis criteria with heart rate of 102 and WBCs of 26.9 and presence of UTI. Slightly elevated lactic acid of 2.45, this improved after aggressive IV fluid hydration. Started on Zosyn for UTI.  UTI This is cause of sepsis, this is a complicated UTI because of recent instrumentation and placement of left-sided ureteric  stent.  Started on Zosyn, was on Keflex, did not tolerate oral medications because of vomiting.  Hypertension Hold blood pressure medications.  Diabetes mellitus type 2 Check hemoglobin A1c, hold metformin. Start carbohydrate modified diet and insulin sliding scale.   Code Status: Full code Family Communication: Plan discussed with the patient in the presence of his wife at bedside. Disposition Plan: MedSurg  Time spent: 70 minutes  Benicia Bergevin A, MD [facility] Pager [phone]

## 2015-10-18 NOTE — ED Notes (Signed)
Pt was made aware urine sample is needed, urinal given.

## 2015-10-18 NOTE — ED Notes (Signed)
I have just given report to [place], [place] on [address]; and will transport shortly.

## 2015-10-18 NOTE — ED Provider Notes (Signed)
CSN: [id number]     Arrival date & time 10/18/15  [id number] History   First MD Initiated Contact with Patient 10/18/15 [phone]     Chief Complaint  Patient presents with  . Fever     (Consider location/radiation/quality/duration/timing/severity/associated sxs/prior Treatment) HPI    Patient is a pleasant 69 year old male presenting today with fever. Patient was seen last night for same. Patient had stents placed on Monday for stone in his left ureter. He initially got better until Friday when he developed fevers nausea, vomiting. Patient seen here yesterday and given one dose of ceftriaxone, home with Keflex. Patient unable to tolerate by mouth antibiotics at home. He had vomiting, chills, fever to 103 last night. Was dosed twice with Tylenol but had persistent fevers.  Patient had no other symptoms of cough, congestion, upper respiratory symptoms, diarrhea.  Past Medical History  Diagnosis Date  . Hypertension   . Hyperlipidemia   . Type 2 diabetes mellitus ([place])   . History of melanoma excision     forearm 2010  . Left ureteral stone   . Glaucoma    Past Surgical History  Procedure Laterality Date  . Melanoma excision  2010    forearm  . Knee arthroscopy Bilateral 2000  . Cataract extraction w/ intraocular lens  implant, bilateral    . Colonoscopy  last one 10-07-2015  . Negative sleep study  YRS AGO per pt  . Cystoscopy with retrograde pyelogram, ureteroscopy and stent placement Left 10/12/2015    Procedure: LEFT RETROGRADE PYELOGRAM, LEFT URETEROSCOPY, LASER LITHOTRIPSY  AND STENT PLACEMENT;  Surgeon: Kathie Rhodes, MD;  Location: [facility];  Service: Urology;  Laterality: Left;  . Holmium laser application Left 0000000    Procedure: HOLMIUM LASER APPLICATION;  Surgeon: Kathie Rhodes, MD;  Location: [facility];  Service: Urology;  Laterality: Left;   No family history on file. Social History  Substance Use Topics  . Smoking status: Former  Smoker -- 20 years    Types: Cigarettes    Quit date: 10/08/1987  . Smokeless tobacco: Never Used  . Alcohol Use: Yes     Comment: rare    Review of Systems  Constitutional: Positive for fever, chills, activity change, appetite change and fatigue.  HENT: Negative for congestion.   Eyes: Negative for discharge.  Respiratory: Negative for cough and shortness of breath.   Cardiovascular: Negative for chest pain.  Gastrointestinal: Positive for nausea, vomiting and abdominal pain.  Genitourinary: Positive for flank pain. Negative for dysuria, urgency and hematuria.  Musculoskeletal: Positive for back pain. Negative for arthralgias.  Allergic/Immunologic: Negative for immunocompromised state.  Neurological: Negative for seizures.  Psychiatric/Behavioral: Negative for agitation.  All other systems reviewed and are negative.     Allergies  Codeine; Oxycodone; and Sulfa antibiotics  Home Medications   Prior to Admission medications   Medication Sig Start Date End Date Taking? Authorizing Provider  amLODipine (NORVASC) 5 MG tablet Take 5 mg by mouth at bedtime.  04/12/15   Historical Provider, MD  atorvastatin (LIPITOR) 10 MG tablet Take 5 mg by mouth every morning.  05/05/15   Historical Provider, MD  cephALEXin (KEFLEX) 500 MG capsule Take 1 capsule (500 mg total) by mouth 2 (two) times daily. 10/17/15   Montine Circle, PA-C  Cholecalciferol (VITAMIN D3) 400 UNITS CAPS Take 400 Units by mouth daily.    Historical Provider, MD  ibuprofen (ADVIL,MOTRIN) 200 MG tablet Take 200 mg by mouth daily as needed for headache, mild pain or  moderate pain.    Historical Provider, MD  latanoprost (XALATAN) 0.005 % ophthalmic solution Place 1 drop into both eyes every evening. 05/26/15   Historical Provider, MD  lisinopril-hydrochlorothiazide (PRINZIDE,ZESTORETIC) 20-12.5 MG per tablet Take 1 tablet by mouth every morning.  04/06/15   Historical Provider, MD  loratadine (CLARITIN) 10 MG tablet Take 10 mg  by mouth daily.    Historical Provider, MD  metFORMIN (GLUCOPHAGE) 1000 MG tablet Take 1,000 mg by mouth 2 (two) times daily. 05/11/15   Historical Provider, MD  Multiple Vitamins-Minerals (MULTIVITAMIN ADULT PO) Take 1 tablet by mouth daily.    Historical Provider, MD  Omega-3 Fatty Acids (FISH OIL) 1200 MG CAPS Take 1,200 mg by mouth 2 (two) times daily.    Historical Provider, MD  phenazopyridine (PYRIDIUM) 200 MG tablet Take 1 tablet (200 mg total) by mouth 3 (three) times daily as needed for pain. Patient not taking: Reported on 10/17/2015 10/12/15   Kathie Rhodes, MD  tamsulosin (FLOMAX) 0.4 MG CAPS capsule Take 1 capsule (0.4 mg total) by mouth daily. Patient not taking: Reported on 10/17/2015 06/03/15   Halil Rentz Lyn Eliane Hammersmith, MD  traMADol (ULTRAM) 50 MG tablet Take 1 tablet (50 mg total) by mouth every 6 (six) hours as needed. Patient taking differently: Take 50 mg by mouth every 6 (six) hours as needed for moderate pain or severe pain.  10/12/15   Kathie Rhodes, MD   Pulse 102  Temp(Src) 99.7 F (37.6 C) (Oral)  Resp 20  SpO2 94% Physical Exam  Constitutional: He is oriented to person, place, and time. He appears well-nourished.  Dry mucus membranes  HENT:  Head: Normocephalic.  Eyes: Conjunctivae are normal.  Neck: No tracheal deviation present.  Cardiovascular:  tachycardic  Pulmonary/Chest: Effort normal. No stridor. No respiratory distress.  Abdominal: Soft. There is tenderness. There is no guarding.  Musculoskeletal: Normal range of motion. He exhibits no edema.  Neurological: He is oriented to person, place, and time. No cranial nerve deficit.  Skin: Skin is warm and dry. No rash noted. He is not diaphoretic.  Psychiatric: He has a normal mood and affect. His behavior is normal.  Nursing note and vitals reviewed.   ED Course  Procedures (including critical care time) Labs Review Labs Reviewed  CBC WITH DIFFERENTIAL/PLATELET  COMPREHENSIVE METABOLIC PANEL   URINALYSIS, ROUTINE W REFLEX MICROSCOPIC (NOT AT [facility])  I-STAT CG4 LACTIC ACID, ED    Imaging Review Dg Abd 1 View  10/17/2015  CLINICAL DATA:  Left flank pain.  Recent stent placement. EXAM: ABDOMEN - 1 VIEW COMPARISON:  Radiographs 10/12/2015. FINDINGS: Double-J left ureteral stent appears well positioned. No definite stone fragments are seen along the course of the stent. The visualized bowel gas pattern is normal. Mild lumbar spine degenerative changes are noted. IMPRESSION: Satisfactory position of left ureteral stent. No stone fragments seen along the course of the stent. Electronically Signed   By: Richardean Sale M.D.   On: 10/17/2015 16:58   I have personally reviewed and evaluated these images and lab results as part of my medical decision-making.   EKG Interpretation None      MDM   Final diagnoses:  None    Patient is a very pleasant 69 year old male presenting here with fevers chills nausea vomiting. Patient had ureteral stent placed on Monday last week. He was seen last night and diagnosed with a urinary tract infection. He received Rocephin and had a KUB showing ureteral stent placement cracks. Discussed with urology and sent home  with by mouth antibiotics.Unfortunately patient unable to tolerate antibiotics at home. He has continued to have nausea vomiting and dehydration as well as fevers.   Will get CT to better characterize, make sure the is no additional infected stones. Will get CXR to make sure w earen;t missing occult pna.  Will treat with IV abx.  Likely require admit.   Admitted with urine tract infencton and failure of outpatietn treatmetn.   Gamal Todisco Julio Alm, MD 10/18/15 [phone]

## 2015-10-18 NOTE — Progress Notes (Addendum)
ANTIBIOTIC CONSULT NOTE - INITIAL  Pharmacy Consult for Zosyn Indication: rule out sepsis, UTI  Allergies  Allergen Reactions  . Codeine Itching  . Oxycodone Itching  . Sulfa Antibiotics Rash    Patient Measurements:    Vital Signs: Temp: 99.3 F (37.4 C) (12/25 0957) Temp Source: Oral (12/25 0957) BP: 125/79 mmHg (12/25 0957) Pulse Rate: 96 (12/25 0957) Intake/Output from previous day:   Intake/Output from this shift:    Labs:  Recent Labs  10/17/15 1444 10/18/15 0943  WBC 17.7* 26.9*  HGB 16.4 15.7  PLT 268 197  CREATININE 1.21 1.15   Estimated Creatinine Clearance: 69.9 mL/min (by C-G formula based on Cr of 1.15). No results for input(s): VANCOTROUGH, VANCOPEAK, VANCORANDOM, GENTTROUGH, GENTPEAK, GENTRANDOM, TOBRATROUGH, TOBRAPEAK, TOBRARND, AMIKACINPEAK, AMIKACINTROU, AMIKACIN in the last 72 hours.   Microbiology: No results found for this or any previous visit (from the past 720 hour(s)).  Medical History: Past Medical History  Diagnosis Date  . Hypertension   . Hyperlipidemia   . Type 2 diabetes mellitus ([place])   . History of melanoma excision     forearm 2010  . Left ureteral stone   . Glaucoma     Medications:  Scheduled:     Assessment: 69 yo M who presents to ED with reports of several days of fever & chills.  He is s/p left-sided ureteric stent placement on 12/19.  He presented to ED on 12/24 and was given Rocephin injection and discharged on Keflex which he did not tolerate due to nausea.  He returns today with same complaints.   Tm since admission 99.63F.  WBC and LA levels elevated.   Renal function appears to be at patient's baseline.  Rocephin & Vancomycin ordered x1 by EDP.  Admitting MD changed antibiotics to Zosyn.   Antimicrobials this admission: 12/25 Vanc x1 dose 12/24 Rocephin>> 12/25 12/25>>Zosyn>>  Levels/dose changes this admission:  Microbiology Results: 12/25 BCx: ordered 12/24 UCx: IP 12/25 UCx: ordered  Goal of  Therapy:  Eradicate infection.  Plan:  Zosyn 3.375gm IV Q8h to be infused over 4hrs Monitor renal function and cx data  No dose adjustments anticipated- pharmacy will sign off. Please re-consult if needed  Chava Dulac, Lavonia Drafts 10/18/2015,11:42 AM

## 2015-10-18 NOTE — ED Notes (Signed)
Per pt, states being treated for UTI-has stent placed in left ureter this past week-fever, chills since last night-nauseated

## 2015-10-18 NOTE — Progress Notes (Signed)
Report received from [place], [place] in ED.  Patient arrived to unit with no complications, stable from report.

## 2015-10-19 ENCOUNTER — Encounter (HOSPITAL_COMMUNITY): Payer: Self-pay | Admitting: Internal Medicine

## 2015-10-19 DIAGNOSIS — E785 Hyperlipidemia, unspecified: Secondary | ICD-10-CM

## 2015-10-19 DIAGNOSIS — A419 Sepsis, unspecified organism: Secondary | ICD-10-CM | POA: Diagnosis present

## 2015-10-19 DIAGNOSIS — Z961 Presence of intraocular lens: Secondary | ICD-10-CM | POA: Diagnosis present

## 2015-10-19 DIAGNOSIS — E1169 Type 2 diabetes mellitus with other specified complication: Secondary | ICD-10-CM | POA: Diagnosis not present

## 2015-10-19 DIAGNOSIS — E119 Type 2 diabetes mellitus without complications: Secondary | ICD-10-CM

## 2015-10-19 DIAGNOSIS — Z79899 Other long term (current) drug therapy: Secondary | ICD-10-CM | POA: Diagnosis not present

## 2015-10-19 DIAGNOSIS — E872 Acidosis: Secondary | ICD-10-CM | POA: Diagnosis present

## 2015-10-19 DIAGNOSIS — R509 Fever, unspecified: Secondary | ICD-10-CM | POA: Diagnosis present

## 2015-10-19 DIAGNOSIS — I10 Essential (primary) hypertension: Secondary | ICD-10-CM | POA: Diagnosis present

## 2015-10-19 DIAGNOSIS — B952 Enterococcus as the cause of diseases classified elsewhere: Secondary | ICD-10-CM | POA: Diagnosis present

## 2015-10-19 DIAGNOSIS — N390 Urinary tract infection, site not specified: Secondary | ICD-10-CM | POA: Diagnosis present

## 2015-10-19 DIAGNOSIS — H409 Unspecified glaucoma: Secondary | ICD-10-CM | POA: Diagnosis present

## 2015-10-19 DIAGNOSIS — Z9841 Cataract extraction status, right eye: Secondary | ICD-10-CM | POA: Diagnosis not present

## 2015-10-19 DIAGNOSIS — D72829 Elevated white blood cell count, unspecified: Secondary | ICD-10-CM

## 2015-10-19 DIAGNOSIS — Z9842 Cataract extraction status, left eye: Secondary | ICD-10-CM | POA: Diagnosis not present

## 2015-10-19 DIAGNOSIS — Z87891 Personal history of nicotine dependence: Secondary | ICD-10-CM | POA: Diagnosis not present

## 2015-10-19 DIAGNOSIS — A4181 Sepsis due to Enterococcus: Secondary | ICD-10-CM | POA: Diagnosis not present

## 2015-10-19 DIAGNOSIS — Z87442 Personal history of urinary calculi: Secondary | ICD-10-CM | POA: Diagnosis not present

## 2015-10-19 LAB — CBC
HEMATOCRIT: 39.9 % (ref 39.0–52.0)
Hemoglobin: 13.6 g/dL (ref 13.0–17.0)
MCH: 30.6 pg (ref 26.0–34.0)
MCHC: 34.1 g/dL (ref 30.0–36.0)
MCV: 89.7 fL (ref 78.0–100.0)
PLATELETS: 187 10*3/uL (ref 150–400)
RBC: 4.45 MIL/uL (ref 4.22–5.81)
RDW: 12.7 % (ref 11.5–15.5)
WBC: 27.1 10*3/uL — AB (ref 4.0–10.5)

## 2015-10-19 LAB — BASIC METABOLIC PANEL
Anion gap: 11 (ref 5–15)
BUN: 18 mg/dL (ref 6–20)
CHLORIDE: 103 mmol/L (ref 101–111)
CO2: 21 mmol/L — AB (ref 22–32)
Calcium: 8.5 mg/dL — ABNORMAL LOW (ref 8.9–10.3)
Creatinine, Ser: 1.17 mg/dL (ref 0.61–1.24)
GFR calc Af Amer: >60 mL/min (ref >60)
GFR calc non Af Amer: >60 mL/min (ref >60)
Glucose, Bld: 204 mg/dL — ABNORMAL HIGH (ref 65–99)
POTASSIUM: 3.7 mmol/L (ref 3.5–5.1)
SODIUM: 135 mmol/L (ref 135–145)

## 2015-10-19 LAB — GLUCOSE, CAPILLARY
GLUCOSE-CAPILLARY: 209 mg/dL — AB (ref 65–99)
GLUCOSE-CAPILLARY: 222 mg/dL — AB (ref 65–99)
Glucose-Capillary: 174 mg/dL — ABNORMAL HIGH (ref 65–99)
Glucose-Capillary: 226 mg/dL — ABNORMAL HIGH (ref 65–99)

## 2015-10-19 NOTE — Progress Notes (Addendum)
Patient ID: Brandon Morrow, male   DOB: [birth year], 69 y.o.   MRN: [id number] [facility] PROGRESS NOTE  Brandon Morrow [id number] DOB: 06/13/1946 DOA: 10/18/2015 PCP: Tivis Ringer, MD  Brief narrative:    69 y.o. male with past medical history of diabetes mellitus type 2, hypertension and dyslipidemia who presented to [facility] ED with fevers and chills. He had recent left-sided ureteric stent placed for kidney stones. In ED, he was febrile, tachycardic, tachypneic. He had leukocytosis and lactic acidosis and his UA showed small leukocytes and rare bacteria. He was started on empiric zosyn.  Assessment/Plan:    Principal Problem:   Sepsis ([place]) / UTI (lower urinary tract infection) / Leukocytosis - Sepsis criteria met on admission with fever of 102.9 F, tachycardia, tachypnea, lactic acidosis  - Source of infection is UTI (UA on admission showed small leukocytes and rare bacteria) - He is on empiric zosyn - Follow up urine culture results  - Blood cultures so far are negative   Active Problems:   Controlled diabetes mellitus without complication, without long-term current use of insulin (HCC) - On metformin at home - Metformin on hold due to sepsis - Continue SSI for now    Benign essential HTN - Continue Norvasc     Dyslipidemia associated with type 2 diabetes mellitus (HCC) - Continue Lipitor   DVT Prophylaxis  - Heparin subQ   Code Status: Full.  Family Communication:  plan of care discussed with the patient and his wife at the bedside  Disposition Plan: Home once UTI and sepsis etiology resolves  IV access:  Peripheral IV  Procedures and diagnostic studies:    Ct Abdomen Pelvis Wo Contrast 10/18/2015 1. No acute abdominal process. 2. Stable position of left ureteral stent, without hydronephrosis or nephrolithiasis. 3. Atherosclerosis, including aortoiliac and coronary artery disease. Please note that although the presence of coronary artery calcium documents the  presence of coronary artery disease, the severity of this disease and any potential stenosis cannot be assessed on this non-gated CT examination. Assessment for potential risk factor modification, dietary therapy or pharmacologic therapy may be warranted, if clinically indicated.   Dg Chest 2 View 10/18/2015 1. Lingular subsegmental atelectasis. 2. Otherwise, no acute cardiopulmonary process.   Dg Abd 1 View 10/17/2015  Satisfactory position of left ureteral stent. No stone fragments seen along the course of the stent.  Medical Consultants:  None   Other Consultants:  None   IAnti-Infectives:   Zosyn 10/18/2015 -->   Leisa Lenz, MD  [facility] Pager [phone]  Time spent in minutes: 25 minutes  If 7PM-7AM, please contact night-coverage [email or web address] Password [email or web address] 10/19/2015, 2:06 PM      HPI/Subjective: No acute overnight events. Patient reports pain controlled.   Objective: Filed Vitals:   10/19/15 0305 10/19/15 0705 10/19/15 1100 10/19/15 1300  BP:  98/62  118/61  Pulse:  73  77  Temp: 98.4 F (36.9 C) 97.5 F (36.4 C)  100.1 F (37.8 C)  TempSrc: Oral Oral Oral Oral  Resp:  20  15  Height:      Weight:      SpO2:  98%  99%    Intake/Output Summary (Last 24 hours) at 10/19/15 1406 Last data filed at 10/19/15 1323  Gross per 24 hour  Intake 3198.8 ml  Output    200 ml  Net 2998.8 ml    Exam:   General:  Pt is alert, follows commands appropriately, not in acute distress  Cardiovascular: Regular rate  and rhythm, S1/S2 (+)  Respiratory: Clear to auscultation bilaterally, no wheezing, no crackles, no rhonchi  Abdomen: Soft, non tender, non distended, bowel sounds present  Extremities: No edema, pulses DP and PT palpable bilaterally  Neuro: Grossly nonfocal  Data Reviewed: Basic Metabolic Panel:  Recent Labs Lab 10/17/15 1444 10/18/15 0943 10/19/15 0443  NA 134* 133* 135  K 4.2 3.7 3.7  CL 99* 99* 103  CO2 24 23 21*  GLUCOSE  212* 237* 204*  BUN _0 CREATININE 1.21 1.15 1.17  CALCIUM 9.3 9.4 8.5*   Liver Function Tests:  Recent Labs Lab 10/17/15 1444 10/18/15 0943  AST 17 18  ALT 26 23  ALKPHOS 57 57  BILITOT 1.1 1.6*  PROT 7.2 6.6  ALBUMIN 4.2 3.8   No results for input(s): LIPASE, AMYLASE in the last 168 hours. No results for input(s): AMMONIA in the last 168 hours. CBC:  Recent Labs Lab 10/17/15 1444 10/18/15 0943 10/19/15 0443  WBC 17.7* 26.9* 27.1*  NEUTROABS 13.9* 22.3*  --   HGB 16.4 15.7 13.6  HCT 47.7 45.2 39.9  MCV 90.7 88.1 89.7  PLT 268 197 187   Cardiac Enzymes: No results for input(s): CKTOTAL, CKMB, CKMBINDEX, TROPONINI in the last 168 hours. BNP: Invalid input(s): POCBNP CBG:  Recent Labs Lab 10/18/15 1712 10/18/15 2214 10/19/15 0745 10/19/15 1144  GLUCAP 252* 218* 209* 222*    Recent Results (from the past 240 hour(s))  Urine culture     Status: None (Preliminary result)   Collection Time: 10/17/15  3:59 PM  Result Value Ref Range Status   Specimen Description URINE, CLEAN CATCH  Final   Special Requests NONE  Final   Culture   Final    >=100,000 COLONIES/mL ENTEROCOCCUS SPECIES Performed at [facility]    Report Status PENDING  Incomplete  Culture, blood (Routine X 2) w Reflex to ID Panel     Status: None (Preliminary result)   Collection Time: 10/18/15  3:03 PM  Result Value Ref Range Status   Specimen Description BLOOD RAC  Final   Special Requests   Final    BOTTLES DRAWN AEROBIC AND ANAEROBIC 10CC BOTH BOTTLES   Culture   Final    NO GROWTH < 24 HOURS Performed at [facility]    Report Status PENDING  Incomplete  Culture, blood (Routine X 2) w Reflex to ID Panel     Status: None (Preliminary result)   Collection Time: 10/18/15  3:15 PM  Result Value Ref Range Status   Specimen Description BLOOD RAC  Final   Special Requests   Final    BOTTLES DRAWN AEROBIC AND ANAEROBIC 10CC BOTH BOTTLES   Culture   Final    NO GROWTH  < 24 HOURS Performed at [facility]    Report Status PENDING  Incomplete     Scheduled Meds: . amLODipine  5 mg Oral QHS  . atorvastatin  5 mg Oral q morning - 10a  . cholecalciferol  400 Units Oral Daily  . heparin  5,000 Units Subcutaneous 3 times per day  . insulin aspart  0-5 Units Subcutaneous QHS  . insulin aspart  0-9 Units Subcutaneous TID WC  . latanoprost  1 drop Both Eyes QPM  . loratadine  10 mg Oral Daily  . piperacillin-tazobactam (ZOSYN)  IV  3.375 g Intravenous Q8H   Continuous Infusions: . sodium chloride 75 mL/hr at 10/19/15 0329

## 2015-10-20 DIAGNOSIS — B952 Enterococcus as the cause of diseases classified elsewhere: Secondary | ICD-10-CM | POA: Diagnosis present

## 2015-10-20 DIAGNOSIS — A4181 Sepsis due to Enterococcus: Secondary | ICD-10-CM | POA: Diagnosis present

## 2015-10-20 DIAGNOSIS — N390 Urinary tract infection, site not specified: Secondary | ICD-10-CM

## 2015-10-20 LAB — GLUCOSE, CAPILLARY
GLUCOSE-CAPILLARY: 228 mg/dL — AB (ref 65–99)
GLUCOSE-CAPILLARY: 292 mg/dL — AB (ref 65–99)
Glucose-Capillary: 184 mg/dL — ABNORMAL HIGH (ref 65–99)
Glucose-Capillary: 194 mg/dL — ABNORMAL HIGH (ref 65–99)

## 2015-10-20 LAB — URINE CULTURE: Culture: 4000

## 2015-10-20 LAB — BASIC METABOLIC PANEL
ANION GAP: 7 (ref 5–15)
BUN: 15 mg/dL (ref 6–20)
CALCIUM: 8.2 mg/dL — AB (ref 8.9–10.3)
CHLORIDE: 104 mmol/L (ref 101–111)
CO2: 24 mmol/L (ref 22–32)
Creatinine, Ser: 1.17 mg/dL (ref 0.61–1.24)
GFR calc non Af Amer: >60 mL/min (ref >60)
Glucose, Bld: 219 mg/dL — ABNORMAL HIGH (ref 65–99)
Potassium: 4.2 mmol/L (ref 3.5–5.1)
SODIUM: 135 mmol/L (ref 135–145)

## 2015-10-20 LAB — HEMOGLOBIN A1C
HEMOGLOBIN A1C: 8.1 % — AB (ref 4.8–5.6)
Mean Plasma Glucose: 186 mg/dL

## 2015-10-20 LAB — CBC
HCT: 38.2 % — ABNORMAL LOW (ref 39.0–52.0)
HEMOGLOBIN: 12.7 g/dL — AB (ref 13.0–17.0)
MCH: 30.1 pg (ref 26.0–34.0)
MCHC: 33.2 g/dL (ref 30.0–36.0)
MCV: 90.5 fL (ref 78.0–100.0)
PLATELETS: 209 10*3/uL (ref 150–400)
RBC: 4.22 MIL/uL (ref 4.22–5.81)
RDW: 12.8 % (ref 11.5–15.5)
WBC: 21.4 10*3/uL — AB (ref 4.0–10.5)

## 2015-10-20 MED ORDER — INSULIN GLARGINE 100 UNIT/ML ~~LOC~~ SOLN
9.0000 [IU] | Freq: Every day | SUBCUTANEOUS | Status: DC
  Administered 2015-10-20 – 2015-10-22 (×3): 9 [IU] via SUBCUTANEOUS
  Filled 2015-10-20 (×4): qty 0.09

## 2015-10-20 NOTE — Care Management Note (Signed)
Case Management Note  Patient Details  Name: Brandon Morrow MRN: [id number] Date of Birth: Jan 08, 1946  Subjective/Objective:          70 yo admitted with Sepsis from UTI          Action/Plan: From home with spouse  Expected Discharge Date:                  Expected Discharge Plan:  Home/Self Care  In-House Referral:     Discharge planning Services  CM Consult  Post Acute Care Choice:    Choice offered to:     DME Arranged:    DME Agency:     HH Arranged:    HH Agency:     Status of Service:  In process, will continue to follow  Medicare Important Message Given:    Date Medicare IM Given:    Medicare IM give by:    Date Additional Medicare IM Given:    Additional Medicare Important Message give by:     If discussed at [place] of Stay Meetings, dates discussed:    Additional Comments:  Lynnell Catalan, RN 10/20/2015, 2:58 PM

## 2015-10-20 NOTE — Progress Notes (Addendum)
Inpatient Diabetes Program Recommendations  AACE/ADA: New Consensus Statement on Inpatient Glycemic Control (2015)  Target Ranges:  Prepandial:   less than 140 mg/dL      Peak postprandial:   less than 180 mg/dL (1-2 hours)      Critically ill patients:  140 - 180 mg/dL    Results for VERNE, CHAUSSEE (MRN [id number]) as of 10/20/2015 09:47  Ref. Range 10/19/2015 07:45 10/19/2015 11:44 10/19/2015 17:04 10/19/2015 21:40  Glucose-Capillary Latest Ref Range: 65-99 mg/dL 209 (H) 222 (H) 174 (H) 226 (H)    Results for CALBERT, SCHOEFF (MRN [id number]) as of 10/20/2015 09:47  Ref. Range 10/20/2015 07:40  Glucose-Capillary Latest Ref Range: 65-99 mg/dL 228 (H)    Admit with: UTI/ Sepsis  History: DM, HTN  Home DM Meds: Metformin 1000 mg bid  Current Insulin Orders: Novolog Sensitive SSI (0-9 units) TID AC + HS     MD- Please consider the following in-hospital insulin adjustments while patient's home Metformin on hold:  1. Start low dose basal insulin- Lantus 9 units daily (0.1 units/kg dosing)  2. Increase Novolog SSI to Moderate scale (0-15 units) TID AC + HS     --Will follow patient during hospitalization--  Wyn Quaker RN, MSN, CDE Diabetes Coordinator Inpatient Glycemic Control Team Team Pager: [phone] (8a-5p)

## 2015-10-20 NOTE — Progress Notes (Signed)
Brandon Morrow was scheduled to return to our office today for double-J stent removal. We were notified that he had been admitted with presumed urosepsis. Urine culture not showing significant growth and blood cultures negative at this point. Patient should be ready for discharged in the next 1-2 days. Once discharge date has been established we will arrange for the patient to come over to our office on the day of discharge to undergo double-J stent removal. We should be able to accommodate him on Thursday if that is the day of discharge. This can be managed by Jiles Crocker, nurse practitioner at [facility] urology. Office number [phone]

## 2015-10-20 NOTE — Progress Notes (Signed)
Patient ID: Brandon Morrow, male   DOB: [birth year], 69 y.o.   MRN: [id number] [facility] PROGRESS NOTE  Brandon Morrow [id number] DOB: 1945/12/22 DOA: 10/18/2015 PCP: Tivis Ringer, MD  Brief narrative:    69 y.o. male with past medical history of diabetes mellitus type 2, hypertension and dyslipidemia who presented to [facility] ED with fevers and chills. He had recent left-sided ureteric stent placed for kidney stones. In ED, he was febrile, tachycardic, tachypneic. He had leukocytosis and lactic acidosis and his UA showed small leukocytes and rare bacteria. He was started on empiric zosyn.  Assessment/Plan:    Principal Problem:   Sepsis ([place]) due to Enterococcus UTI /  Leukocytosis - Sepsis criteria were met on admission with fever of 102.9 F, tachycardia, tachypnea, lactic acidosis and leukocytosis. UTI presumed source of infection - Started on empiric zosyn - Urine culture grew enterococcus species, sensitivity report is pending  - Blood cultures negative so far  Active Problems:   Controlled diabetes mellitus without complication, without long-term current use of insulin (HCC) - On metformin at home - Metformin on hold due to sepsis - CBG's in past 24 hours: 174, 556, 228 - Per DM coordinator we started Lantus 9 units daily and he is on SSI    Benign essential HTN - Continue Norvasc     Dyslipidemia associated with type 2 diabetes mellitus (HCC) - Continue Lipitor   DVT Prophylaxis  - Heparin subQ in hospital   Code Status: Full.  Family Communication:  plan of care discussed with the patient and his wife at the bedside  Disposition Plan: Home once UTI and sepsis etiology resolves  IV access:  Peripheral IV  Procedures and diagnostic studies:    Ct Abdomen Pelvis Wo Contrast 10/18/2015 1. No acute abdominal process. 2. Stable position of left ureteral stent, without hydronephrosis or nephrolithiasis. 3. Atherosclerosis, including aortoiliac and coronary artery  disease. Please note that although the presence of coronary artery calcium documents the presence of coronary artery disease, the severity of this disease and any potential stenosis cannot be assessed on this non-gated CT examination. Assessment for potential risk factor modification, dietary therapy or pharmacologic therapy may be warranted, if clinically indicated.   Dg Chest 2 View 10/18/2015 1. Lingular subsegmental atelectasis. 2. Otherwise, no acute cardiopulmonary process.   Dg Abd 1 View 10/17/2015  Satisfactory position of left ureteral stent. No stone fragments seen along the course of the stent.  Medical Consultants:  None   Other Consultants:  None   IAnti-Infectives:   Zosyn 10/18/2015 -->   Leisa Lenz, MD  [facility] Pager [phone]  Time spent in minutes: 25 minutes  If 7PM-7AM, please contact night-coverage [email or web address] Password [facility] 10/20/2015, 10:29 AM   LOS: 1 day    HPI/Subjective: No acute overnight events. Patient reports he feels better.  Objective: Filed Vitals:   10/19/15 1100 10/19/15 1300 10/19/15 2145 10/20/15 0610  BP:  118/61 112/67 111/76  Pulse:  77 82 84  Temp:  100.1 F (37.8 C) 99 F (37.2 C) 99.2 F (37.3 C)  TempSrc: Oral Oral Oral Oral  Resp:  15 16 20   Height:      Weight:      SpO2:  99% 96% 96%    Intake/Output Summary (Last 24 hours) at 10/20/15 1029 Last data filed at 10/20/15 0900  Gross per 24 hour  Intake   2825 ml  Output      0 ml  Net   2825 ml  Exam:   General:  Pt is alert, not in acute distress  Cardiovascular: RRR, appreciate S1/S2  Respiratory: No wheezing, no crackles, no rhonchi  Abdomen: non distended, non tender, (+) BS  Extremities: No leg swelling, palpable pulses  Neuro: Nonfocal  Data Reviewed: Basic Metabolic Panel:  Recent Labs Lab 10/17/15 1444 10/18/15 0943 10/19/15 0443 10/20/15 0540  NA 134* 133* 135 135  K 4.2 3.7 3.7 4.2  CL 99* 99* 103 104  CO2 24 23  21* 24  GLUCOSE 212* 237* 204* 219*  BUN 16 17 18 15   CREATININE 1.21 1.15 1.17 1.17  CALCIUM 9.3 9.4 8.5* 8.2*   Liver Function Tests:  Recent Labs Lab 10/17/15 1444 10/18/15 0943  AST 17 18  ALT 26 23  ALKPHOS 57 57  BILITOT 1.1 1.6*  PROT 7.2 6.6  ALBUMIN 4.2 3.8   No results for input(s): LIPASE, AMYLASE in the last 168 hours. No results for input(s): AMMONIA in the last 168 hours. CBC:  Recent Labs Lab 10/17/15 1444 10/18/15 0943 10/19/15 0443 10/20/15 0540  WBC 17.7* 26.9* 27.1* 21.4*  NEUTROABS 13.9* 22.3*  --   --   HGB 16.4 15.7 13.6 12.7*  HCT 47.7 45.2 39.9 38.2*  MCV 90.7 88.1 89.7 90.5  PLT 268 197 187 209   Cardiac Enzymes: No results for input(s): CKTOTAL, CKMB, CKMBINDEX, TROPONINI in the last 168 hours. BNP: Invalid input(s): POCBNP CBG:  Recent Labs Lab 10/19/15 0745 10/19/15 1144 10/19/15 1704 10/19/15 2140 10/20/15 0740  GLUCAP 209* 222* 174* 226* 228*    Urine culture     Status: None (Preliminary result)   Collection Time: 10/17/15  3:59 PM  Result Value Ref Range Status   Specimen Description URINE, CLEAN CATCH  Final   Special Requests NONE  Final   Culture   Final    >=100,000 COLONIES/mL ENTEROCOCCUS SPECIES SUSCEPTIBILITIES TO FOLLOW Performed at [facility] [address]    Report Status PENDING  Incomplete  Culture, blood (Routine X 2) w Reflex to ID Panel     Status: None (Preliminary result)   Collection Time: 10/18/15  3:03 PM  Result Value Ref Range Status   Specimen Description BLOOD RAC  Final   Special Requests   Final    BOTTLES DRAWN AEROBIC AND ANAEROBIC 10CC BOTH BOTTLES   Culture   Final    NO GROWTH 2 DAYS Performed at [facility]    Report Status PENDING  Incomplete  Culture, blood (Routine X 2) w Reflex to ID Panel     Status: None (Preliminary result)   Collection Time: 10/18/15  3:15 PM  Result Value Ref Range Status   Specimen Description BLOOD RAC  Final   Special Requests   Final     BOTTLES DRAWN AEROBIC AND ANAEROBIC 10CC BOTH BOTTLES   Culture   Final    NO GROWTH 2 DAYS Performed at [facility]    Report Status PENDING  Incomplete     Scheduled Meds: . amLODipine  5 mg Oral QHS  . atorvastatin  5 mg Oral q morning - 10a  . cholecalciferol  400 Units Oral Daily  . heparin  5,000 Units Subcutaneous 3 times per day  . insulin aspart  0-5 Units Subcutaneous QHS  . insulin aspart  0-9 Units Subcutaneous TID WC  . insulin glargine  9 Units Subcutaneous Daily  . latanoprost  1 drop Both Eyes QPM  . loratadine  10 mg Oral Daily  . piperacillin-tazobactam (  ZOSYN)  IV  3.375 g Intravenous Q8H   Continuous Infusions: . sodium chloride 1,000 mL (10/19/15 1659)

## 2015-10-21 DIAGNOSIS — A4181 Sepsis due to Enterococcus: Secondary | ICD-10-CM

## 2015-10-21 LAB — BASIC METABOLIC PANEL
ANION GAP: 6 (ref 5–15)
BUN: 16 mg/dL (ref 6–20)
CHLORIDE: 109 mmol/L (ref 101–111)
CO2: 22 mmol/L (ref 22–32)
CREATININE: 1.08 mg/dL (ref 0.61–1.24)
Calcium: 8 mg/dL — ABNORMAL LOW (ref 8.9–10.3)
GLUCOSE: 192 mg/dL — AB (ref 65–99)
Potassium: 3.5 mmol/L (ref 3.5–5.1)
SODIUM: 137 mmol/L (ref 135–145)

## 2015-10-21 LAB — CBC
HEMATOCRIT: 39 % (ref 39.0–52.0)
Hemoglobin: 13.2 g/dL (ref 13.0–17.0)
MCH: 30.3 pg (ref 26.0–34.0)
MCHC: 33.8 g/dL (ref 30.0–36.0)
MCV: 89.7 fL (ref 78.0–100.0)
PLATELETS: 239 10*3/uL (ref 150–400)
RBC: 4.35 MIL/uL (ref 4.22–5.81)
RDW: 12.7 % (ref 11.5–15.5)
WBC: 17.4 10*3/uL — AB (ref 4.0–10.5)

## 2015-10-21 LAB — GLUCOSE, CAPILLARY
Glucose-Capillary: 123 mg/dL — ABNORMAL HIGH (ref 65–99)
Glucose-Capillary: 157 mg/dL — ABNORMAL HIGH (ref 65–99)
Glucose-Capillary: 159 mg/dL — ABNORMAL HIGH (ref 65–99)

## 2015-10-21 NOTE — Progress Notes (Addendum)
Patient ID: Brandon Morrow, male   DOB: [birth year], 69 y.o.   MRN: [id number] [facility] PROGRESS NOTE  Brandon Morrow [id number] DOB: February 01, 1946 DOA: 10/18/2015 PCP: Tivis Ringer, MD  Brief narrative:    69 y.o. male with past medical history of diabetes mellitus type 2, hypertension and dyslipidemia who presented to [facility] ED with fevers and chills. He had recent left-sided ureteric stent placed for kidney stones. In ED, he was febrile, tachycardic, tachypneic. He had leukocytosis and lactic acidosis and his UA showed small leukocytes and rare bacteria. He was started on empiric zosyn.  Assessment/Plan:    Principal Problem:   Sepsis ([place]) due to Enterococcus UTI /  Leukocytosis - Sepsis criteria met on admission with fever of 102.9 F, tachycardia, tachypnea, lactic acidosis and leukocytosis. Now we know that urine culture is growing enterococcus species and this is most likely source of infection - Continue zosyn - Blood cultures negative - Leukocytosis is improving  - Per GI, ureteral stent will be removed prior to the discharge   Active Problems:   Controlled diabetes mellitus without complication, without long-term current use of insulin (HCC) - Metformin on hold due to sepsis - Per DM coordinator we started Lantus 9 units daily - CBG's in past 24 hours: 184, 194, 292    Benign essential HTN - Continue Norvasc  - BP 100/66    Dyslipidemia associated with type 2 diabetes mellitus (HCC) - Continue Lipitor   DVT Prophylaxis  - Heparin subQ   Code Status: Full.  Family Communication:  plan of care discussed with the patient and his wife at the bedside  Disposition Plan: Home likely 10/22/2015.  IV access:  Peripheral IV  Procedures and diagnostic studies:    Ct Abdomen Pelvis Wo Contrast 10/18/2015 1. No acute abdominal process. 2. Stable position of left ureteral stent, without hydronephrosis or nephrolithiasis. 3. Atherosclerosis, including aortoiliac and  coronary artery disease. Please note that although the presence of coronary artery calcium documents the presence of coronary artery disease, the severity of this disease and any potential stenosis cannot be assessed on this non-gated CT examination. Assessment for potential risk factor modification, dietary therapy or pharmacologic therapy may be warranted, if clinically indicated.   Dg Chest 2 View 10/18/2015 1. Lingular subsegmental atelectasis. 2. Otherwise, no acute cardiopulmonary process.   Dg Abd 1 View 10/17/2015  Satisfactory position of left ureteral stent. No stone fragments seen along the course of the stent.  Medical Consultants:  Urology    Other Consultants:  None   IAnti-Infectives:   Zosyn 10/18/2015 -->   Leisa Lenz, MD  [facility] Pager [phone]  Time spent in minutes: 25 minutes  If 7PM-7AM, please contact night-coverage [email or web address] Password [facility] 10/21/2015, 7:14 AM   LOS: 2 days    HPI/Subjective: No acute overnight events. Patient reports pain is controlled.  Objective: Filed Vitals:   10/20/15 0610 10/20/15 1340 10/20/15 2137 10/21/15 0543  BP: 111/76 110/62 123/66 100/66  Pulse: 84 97 79 77  Temp: 99.2 F (37.3 C) 98.4 F (36.9 C) 98.7 F (37.1 C) 98.8 F (37.1 C)  TempSrc: Oral Oral Oral Oral  Resp: _0 Height:      Weight:      SpO2: 96% 96% 96% 96%    Intake/Output Summary (Last 24 hours) at 10/21/15 0714 Last data filed at 10/20/15 1230  Gross per 24 hour  Intake    600 ml  Output      0  ml  Net    600 ml    Exam:   General:  Pt is alert, awake, no distress  Cardiovascular: Rate controlled, appreciate S1, S2   Respiratory: bilateral air entry, no wheezing   Abdomen: (+) BS, non tender   Extremities: No edema, pulses palpable   Neuro: No focal deficits   Data Reviewed: Basic Metabolic Panel:  Recent Labs Lab 10/17/15 1444 10/18/15 0943 10/19/15 0443 10/20/15 0540 10/21/15 0355  NA 134*  133* 135 135 137  K 4.2 3.7 3.7 4.2 3.5  CL 99* 99* 103 104 109  CO2 24 23 21* 24 22  GLUCOSE 212* 237* 204* 219* 192*  BUN _0 CREATININE 1.21 1.15 1.17 1.17 1.08  CALCIUM 9.3 9.4 8.5* 8.2* 8.0*   Liver Function Tests:  Recent Labs Lab 10/17/15 1444 10/18/15 0943  AST 17 18  ALT 26 23  ALKPHOS 57 57  BILITOT 1.1 1.6*  PROT 7.2 6.6  ALBUMIN 4.2 3.8   No results for input(s): LIPASE, AMYLASE in the last 168 hours. No results for input(s): AMMONIA in the last 168 hours. CBC:  Recent Labs Lab 10/17/15 1444 10/18/15 0943 10/19/15 0443 10/20/15 0540  WBC 17.7* 26.9* 27.1* 21.4*  NEUTROABS 13.9* 22.3*  --   --   HGB 16.4 15.7 13.6 12.7*  HCT 47.7 45.2 39.9 38.2*  MCV 90.7 88.1 89.7 90.5  PLT 268 197 187 209   Cardiac Enzymes: No results for input(s): CKTOTAL, CKMB, CKMBINDEX, TROPONINI in the last 168 hours. BNP: Invalid input(s): POCBNP CBG:  Recent Labs Lab 10/19/15 2140 10/20/15 0740 10/20/15 1129 10/20/15 1723 10/20/15 2120  GLUCAP 226* 228* 184* 194* 292*    Urine culture     Status: None (Preliminary result)   Collection Time: 10/17/15  3:59 PM  Result Value Ref Range Status   Specimen Description URINE, CLEAN CATCH  Final   Special Requests NONE  Final   Culture   Final    >=100,000 COLONIES/mL ENTEROCOCCUS SPECIES SUSCEPTIBILITIES TO FOLLOW Performed at [facility]    Report Status PENDING  Incomplete  Culture, blood (Routine X 2) w Reflex to ID Panel     Status: None (Preliminary result)   Collection Time: 10/18/15  3:03 PM  Result Value Ref Range Status   Specimen Description BLOOD RAC  Final   Special Requests   Final    BOTTLES DRAWN AEROBIC AND ANAEROBIC 10CC BOTH BOTTLES   Culture   Final    NO GROWTH 2 DAYS Performed at [facility]    Report Status PENDING  Incomplete  Culture, blood (Routine X 2) w Reflex to ID Panel     Status: None (Preliminary result)   Collection Time: 10/18/15  3:15 PM  Result  Value Ref Range Status   Specimen Description BLOOD RAC  Final   Special Requests   Final    BOTTLES DRAWN AEROBIC AND ANAEROBIC 10CC BOTH BOTTLES   Culture   Final    NO GROWTH 2 DAYS Performed at [facility]    Report Status PENDING  Incomplete     Scheduled Meds: . amLODipine  5 mg Oral QHS  . atorvastatin  5 mg Oral q morning - 10a  . cholecalciferol  400 Units Oral Daily  . heparin  5,000 Units Subcutaneous 3 times per day  . insulin aspart  0-5 Units Subcutaneous QHS  . insulin aspart  0-9 Units Subcutaneous TID WC  . insulin glargine  9 Units Subcutaneous Daily  . latanoprost  1 drop Both Eyes QPM  . loratadine  10 mg Oral Daily  . piperacillin-tazobactam (ZOSYN)  IV  3.375 g Intravenous Q8H   Continuous Infusions: . sodium chloride 1,000 mL (10/19/15 1659)

## 2015-10-22 LAB — CBC
HCT: 38.3 % — ABNORMAL LOW (ref 39.0–52.0)
Hemoglobin: 13 g/dL (ref 13.0–17.0)
MCH: 30.1 pg (ref 26.0–34.0)
MCHC: 33.9 g/dL (ref 30.0–36.0)
MCV: 88.7 fL (ref 78.0–100.0)
PLATELETS: 256 10*3/uL (ref 150–400)
RBC: 4.32 MIL/uL (ref 4.22–5.81)
RDW: 12.4 % (ref 11.5–15.5)
WBC: 14.3 10*3/uL — ABNORMAL HIGH (ref 4.0–10.5)

## 2015-10-22 LAB — BASIC METABOLIC PANEL
Anion gap: 8 (ref 5–15)
BUN: 12 mg/dL (ref 6–20)
CO2: 25 mmol/L (ref 22–32)
CREATININE: 1.06 mg/dL (ref 0.61–1.24)
Calcium: 8.2 mg/dL — ABNORMAL LOW (ref 8.9–10.3)
Chloride: 106 mmol/L (ref 101–111)
GFR calc Af Amer: >60 mL/min (ref >60)
GLUCOSE: 174 mg/dL — AB (ref 65–99)
POTASSIUM: 4.3 mmol/L (ref 3.5–5.1)
SODIUM: 139 mmol/L (ref 135–145)

## 2015-10-22 LAB — GLUCOSE, CAPILLARY
GLUCOSE-CAPILLARY: 164 mg/dL — AB (ref 65–99)
Glucose-Capillary: 139 mg/dL — ABNORMAL HIGH (ref 65–99)

## 2015-10-22 MED ORDER — AMPICILLIN 250 MG PO CAPS: 250.0000 mg | ORAL_CAPSULE | Freq: Three times a day (TID) | ORAL | Status: DC

## 2015-10-22 NOTE — Discharge Instructions (Signed)
Urinary Tract Infection Urinary tract infections (UTIs) can develop anywhere along your urinary tract. Your urinary tract is your body's drainage system for removing wastes and extra water. Your urinary tract includes two kidneys, two ureters, a bladder, and a urethra. Your kidneys are a pair of bean-shaped organs. Each kidney is about the size of your fist. They are located below your ribs, one on each side of your spine. CAUSES Infections are caused by microbes, which are microscopic organisms, including fungi, viruses, and bacteria. These organisms are so small that they can only be seen through a microscope. Bacteria are the microbes that most commonly cause UTIs. SYMPTOMS  Symptoms of UTIs may vary by age and gender of the patient and by the location of the infection. Symptoms in young women typically include a frequent and intense urge to urinate and a painful, burning feeling in the bladder or urethra during urination. Older women and men are more likely to be tired, shaky, and weak and have muscle aches and abdominal pain. A fever may mean the infection is in your kidneys. Other symptoms of a kidney infection include pain in your back or sides below the ribs, nausea, and vomiting. DIAGNOSIS To diagnose a UTI, your caregiver will ask you about your symptoms. Your caregiver will also ask you to provide a urine sample. The urine sample will be tested for bacteria and white blood cells. White blood cells are made by your body to help fight infection. TREATMENT  Typically, UTIs can be treated with medication. Because most UTIs are caused by a bacterial infection, they usually can be treated with the use of antibiotics. The choice of antibiotic and length of treatment depend on your symptoms and the type of bacteria causing your infection. HOME CARE INSTRUCTIONS  If you were prescribed antibiotics, take them exactly as your caregiver instructs you. Finish the medication even if you feel better after  you have only taken some of the medication.  Drink enough water and fluids to keep your urine clear or pale yellow.  Avoid caffeine, tea, and carbonated beverages. They tend to irritate your bladder.  Empty your bladder often. Avoid holding urine for long periods of time.  Empty your bladder before and after sexual intercourse.  After a bowel movement, women should cleanse from front to back. Use each tissue only once. SEEK MEDICAL CARE IF:   You have back pain.  You develop a fever.  Your symptoms do not begin to resolve within 3 days. SEEK IMMEDIATE MEDICAL CARE IF:   You have severe back pain or lower abdominal pain.  You develop chills.  You have nausea or vomiting.  You have continued burning or discomfort with urination. MAKE SURE YOU:   Understand these instructions.  Will watch your condition.  Will get help right away if you are not doing well or get worse.   This information is not intended to replace advice given to you by your health care provider. Make sure you discuss any questions you have with your health care provider.   Document Released: 07/20/2005 Document Revised: 07/01/2015 Document Reviewed: 11/18/2011 Elsevier Interactive Patient Education 2016 Elsevier Inc. Ampicillin capsules What is this medicine? AMPICILLIN (am pi SILL in) is a penicillin antibiotic. It is used to treat certain kinds of bacterial infections. It will not work for colds, flu, or other viral infections. This medicine may be used for other purposes; ask your health care provider or pharmacist if you have questions. What should I tell my health  care provider before I take this medicine? They need to know if you have any of these conditions: -bowel disease, like colitis -heart disease -kidney disease -liver disease -seizures disorder -an unusual or allergic reaction to ampicillin, other penicillins or antibiotics, other medicines, foods, dyes, or preservatives -pregnant or  trying to get pregnant -breast-feeding How should I use this medicine? Take this medicine by mouth with a full glass of water. Follow the directions on the prescription label. Take this medicine on an empty stomach, at least 30 minutes before or 2 hours after food. Do not take with food. Take your medicine at regular intervals. Do not take your medicine more often than directed. Take all of your medicine as directed even if you think you are better. Do not skip doses or stop your medicine early. Talk to your pediatrician regarding the use of this medicine in children. Special care may be needed. Overdosage: If you think you have taken too much of this medicine contact a poison control center or emergency room at once. NOTE: This medicine is only for you. Do not share this medicine with others. What if I miss a dose? If you miss a dose, take it as soon as you can. If it is almost time for your next dose, take only that dose. Do not take double or extra doses. What may interact with this medicine? -allopurinol -birth control pills -chloroquine -methotrexate -probenecid -some other antibiotics like erythromycin, tetracycline This list may not describe all possible interactions. Give your health care provider a list of all the medicines, herbs, non-prescription drugs, or dietary supplements you use. Also tell them if you smoke, drink alcohol, or use illegal drugs. Some items may interact with your medicine. What should I watch for while using this medicine? Tell your doctor or health care professional if your symptoms do not improve or if you get new symptoms. Do not treat diarrhea with over the counter products. Contact your doctor if you have diarrhea that lasts more than 2 days or if it is severe and watery. This medicine can interfere with some urine glucose tests. If you use such tests, talk with your health care professional. Birth control pills may not work properly while you are taking this  medicine. Talk to your doctor about using an extra method of birth control. What side effects may I notice from receiving this medicine? Side effects that you should report to your doctor or health care professional as soon as possible: -allergic reactions like skin rash, itching or hives, swelling of the face, lips, or tongue -breathing problems -dark urine -fever -pain or difficulty passing urine -pain when swallowing -redness, blistering, peeling or loosening of the skin, including inside the mouth -seizures -unusual bleeding, bruising -unusually weak or tired Side effects that usually do not require medical attention (report to your doctor or health care professional if they continue or are bothersome): -diarrhea -dizziness -headache -loss of appetite -nausea, vomiting -sore mouth, tongue -stomach upset This list may not describe all possible side effects. Call your doctor for medical advice about side effects. You may report side effects to FDA at 1-800-FDA-1088. Where should I keep my medicine? Keep out of the reach of children. Store at room temperature between 20 and 25 degrees C (68 and 77 degrees F). Keep container tightly closed. Throw away any unused medicine after the expiration date. NOTE: This sheet is a summary. It may not cover all possible information. If you have questions about this medicine, talk to your  doctor, pharmacist, or health care provider.    2016, Elsevier/Gold Standard. (2008-01-01 14:27:36)

## 2015-10-22 NOTE — Progress Notes (Signed)
Pt discharged to home via private vehicle. Pt going straight to [facility] for double J stent removal at discharge. IV discontinued and site is clean, dry and intact. Discharge instructions given via teach back method and all questions answered.

## 2015-10-22 NOTE — Care Management Important Message (Signed)
Important Message  Patient Details  Name: Brandon Morrow MRN: [id number] Date of Birth: [birth year]   Medicare Important Message Given:  Yes    Camillo Flaming 10/22/2015, 12:17 [place] Message  Patient Details  Name: Brandon Morrow MRN: [id number] Date of Birth: 01-04-1946   Medicare Important Message Given:  Yes    Camillo Flaming 10/22/2015, 12:17 PM

## 2015-10-22 NOTE — Discharge Summary (Signed)
Physician Discharge Summary  Brandon Morrow OMA:[id number] DOB: August 06, 1946 DOA: 10/18/2015  PCP: Tivis Ringer, MD  Admit date: 10/18/2015 Discharge date: 10/22/2015  Recommendations for Outpatient Follow-up:  1. Patient will continue ampicillin on discharge for 10 days for enterococcus UTI. His repeat urine culture did not produce significant growth. Stent will be removed after the discharge.  Discharge Diagnoses:  Principal Problem:   Sepsis due to enterococcus [facility]) Active Problems:   Sepsis ([place])   Leukocytosis   Enterococcus UTI   Controlled diabetes mellitus without complication, without long-term current use of insulin (HCC)   Benign essential HTN   Dyslipidemia associated with type 2 diabetes mellitus ([place])    Discharge Condition: stable   Diet recommendation: as tolerated   History of present illness:  69 y.o. male with past medical history of diabetes mellitus type 2, hypertension and dyslipidemia who presented to [facility] ED with fevers and chills. He had recent left-sided ureteric stent placed for kidney stones. In ED, he was febrile, tachycardic, tachypneic. He had leukocytosis and lactic acidosis and his UA showed small leukocytes and rare bacteria. He was started on empiric zosyn.  Hospital Course:   Assessment/Plan:    Principal Problem:  Sepsis ([place]) due to Enterococcus UTI / Leukocytosis - Sepsis criteria met on admission with fever of 102.9 F, tachycardia, tachypnea, lactic acidosis and leukocytosis. Now we know that urine culture is growing enterococcus species and this is most likely source of infection - Patient was on Zosyn from the time of the admission. We will continue with the ampicillin on discharge for 10 days 500 mg every 8 hours. Repeat urine culture did not produce significant growth. - Blood cultures negative - Leukocytosis is improving  - Per GI, ureteral stent will be removed after the discharge today.  Active Problems:  Controlled  diabetes mellitus without complication, without long-term current use of insulin (HCC) - Resume metformin on discharge   Benign essential HTN - Continue Norvasc    Dyslipidemia associated with type 2 diabetes mellitus (HCC) - Continue Lipitor   DVT Prophylaxis  - Heparin subQ in hospital   Code Status: Full.  Family Communication: plan of care discussed with the patient and his wife at the bedside    IV access:  Peripheral IV  Procedures and diagnostic studies:   Ct Abdomen Pelvis Wo Contrast 10/18/2015 1. No acute abdominal process. 2. Stable position of left ureteral stent, without hydronephrosis or nephrolithiasis. 3. Atherosclerosis, including aortoiliac and coronary artery disease. Please note that although the presence of coronary artery calcium documents the presence of coronary artery disease, the severity of this disease and any potential stenosis cannot be assessed on this non-gated CT examination. Assessment for potential risk factor modification, dietary therapy or pharmacologic therapy may be warranted, if clinically indicated.   Dg Chest 2 View 10/18/2015 1. Lingular subsegmental atelectasis. 2. Otherwise, no acute cardiopulmonary process.   Dg Abd 1 View 10/17/2015 Satisfactory position of left ureteral stent. No stone fragments seen along the course of the stent.  Medical Consultants:  Urology   Other Consultants:  None   IAnti-Infectives:   Zosyn 10/18/2015 --> 10/22/2015   Signed:  Leisa Lenz, MD  [facility] 10/22/2015, 10:42 AM  Pager #: [phone]  Time spent in minutes: more than 30 minutes  Discharge Exam: Filed Vitals:   10/21/15 2119 10/22/15 0559  BP: 139/92 131/82  Pulse: 87 93  Temp: 98.7 F (37.1 C) 99.2 F (37.3 C)  Resp: 20 16   Filed Vitals:  10/21/15 0543 10/21/15 1602 10/21/15 2119 10/22/15 0559  BP: 100/66 151/90 139/92 131/82  Pulse: 77 80 87 93  Temp: 98.8 F (37.1 C) 98.4 F (36.9  C) 98.7 F (37.1 C) 99.2 F (37.3 C)  TempSrc: Oral Oral Oral Oral  Resp: 17 16 20 16   Height:      Weight:      SpO2: 96% 97% 96% 98%    General: Pt is alert, follows commands appropriately, not in acute distress Cardiovascular: Regular rate and rhythm, S1/S2 +, no murmurs Respiratory: Clear to auscultation bilaterally, no wheezing, no crackles, no rhonchi Abdominal: Soft, non tender, non distended, bowel sounds +, no guarding Extremities: no edema, no cyanosis, pulses palpable bilaterally DP and PT Neuro: Grossly nonfocal  Discharge Instructions  Discharge Instructions    Call MD for:  difficulty breathing, headache or visual disturbances    Complete by:  As directed      Call MD for:  persistant dizziness or light-headedness    Complete by:  As directed      Call MD for:  persistant nausea and vomiting    Complete by:  As directed      Call MD for:  severe uncontrolled pain    Complete by:  As directed      Diet - low sodium heart healthy    Complete by:  As directed      Discharge instructions    Complete by:  As directed   1. Continue ampicillin 500 mg every 8 hours for 10 days on discharge.     Increase activity slowly    Complete by:  As directed             Medication List    STOP taking these medications        cephALEXin 500 MG capsule  Commonly known as:  KEFLEX      TAKE these medications        acetaminophen 500 MG tablet  Commonly known as:  TYLENOL  Take 1,000 mg by mouth every 6 (six) hours as needed for moderate pain.     amLODipine 5 MG tablet  Commonly known as:  NORVASC  Take 5 mg by mouth at bedtime.     ampicillin 250 MG capsule  Commonly known as:  PRINCIPEN  Take 1 capsule (250 mg total) by mouth 3 (three) times daily.     atorvastatin 10 MG tablet  Commonly known as:  LIPITOR  Take 5 mg by mouth every morning.     Fish Oil 1200 MG Caps  Take 1,200 mg by mouth 2 (two) times daily.     ibuprofen 200 MG tablet  Commonly known  as:  ADVIL,MOTRIN  Take 400 mg by mouth daily as needed for headache, mild pain or moderate pain.     latanoprost 0.005 % ophthalmic solution  Commonly known as:  XALATAN  Place 1 drop into both eyes every evening.     lisinopril-hydrochlorothiazide 20-12.5 MG tablet  Commonly known as:  PRINZIDE,ZESTORETIC  Take 1 tablet by mouth every morning.     loratadine 10 MG tablet  Commonly known as:  CLARITIN  Take 10 mg by mouth daily.     metFORMIN 1000 MG tablet  Commonly known as:  GLUCOPHAGE  Take 1,000 mg by mouth 2 (two) times daily.     MULTIVITAMIN ADULT PO  Take 1 tablet by mouth daily.     Vitamin D3 400 units Caps  Take 400 Units by mouth  daily.           Follow-up Information    Follow up with Tivis Ringer, MD. Schedule an appointment as soon as possible for a visit in 1 week.   Specialty:  Internal Medicine   Why:  Follow up appt after recent hospitalization   Contact information:   [place] [address] [phone]        The results of significant diagnostics from this hospitalization (including imaging, microbiology, ancillary and laboratory) are listed below for reference.    Significant Diagnostic Studies: Ct Abdomen Pelvis Wo Contrast  10/18/2015  CLINICAL DATA:  Per pt, states he had kidney stones removed this week-states fever started last night-chills-states urinary frequency, and left flank pain-stent placed on left-states he went to UC and UA was negative-sent here for the fever Hx of diabetes EXAM: CT ABDOMEN AND PELVIS WITHOUT CONTRAST TECHNIQUE: Multidetector CT imaging of the abdomen and pelvis was performed following the standard protocol without IV contrast. COMPARISON:  06/03/2015 FINDINGS: Moderate coronary calcifications. Linear scarring or subsegmental atelectasis in the visualized lung bases right greater than left. Unremarkable liver, nondilated gallbladder, spleen, adrenal glands, pancreas, and kidneys. Unenhanced CT  was performed per clinician order. Lack of IV contrast limits sensitivity and specificity, especially for evaluation of abdominal/pelvic solid viscera. Left double-J ureteral stent in expected location. No hydronephrosis. No nephrolithiasis. Urinary bladder physiologically distended. Mild prostatic prominence. Small hiatal hernia. Stomach, small bowel, and colon are nondilated. Appendix not identified. Patchy aortoiliac arterial plaque without aneurysm. No free air.  No ascites.  No adenopathy. Multilevel spondylitic changes in the lumbar spine. Bilateral hip DJD. IMPRESSION: 1. No acute abdominal process. 2. Stable position of left ureteral stent, without hydronephrosis or nephrolithiasis. 3. Atherosclerosis, including aortoiliac and coronary artery disease. Please note that although the presence of coronary artery calcium documents the presence of coronary artery disease, the severity of this disease and any potential stenosis cannot be assessed on this non-gated CT examination. Assessment for potential risk factor modification, dietary therapy or pharmacologic therapy may be warranted, if clinically indicated. Electronically Signed   By: Lucrezia Europe M.D.   On: 10/18/2015 09:39   Dg Chest 2 View  10/18/2015  CLINICAL DATA:  69 year old male with fever, chills and vomiting. Recent placement of the day double-J ureteral stent EXAM: CHEST  2 VIEW COMPARISON:  Fluoroscopic images 1219 2016; prior chest x-ray 06/03/2015 FINDINGS: Focal patchy airspace opacity in the lingula appears linear in nature and is favored to reflect atelectasis. Otherwise, the lungs are clear. No pleural effusion or pneumothorax. Cardiac and mediastinal contours are within normal limits. No acute fracture or lytic or blastic osseous lesions. The visualized upper abdominal bowel gas pattern is unremarkable. IMPRESSION: 1. Lingular subsegmental atelectasis. 2. Otherwise, no acute cardiopulmonary process. Electronically Signed   By: Jacqulynn Cadet M.D.   On: 10/18/2015 09:31   Dg Abd 1 View  10/17/2015  CLINICAL DATA:  Left flank pain.  Recent stent placement. EXAM: ABDOMEN - 1 VIEW COMPARISON:  Radiographs 10/12/2015. FINDINGS: Double-J left ureteral stent appears well positioned. No definite stone fragments are seen along the course of the stent. The visualized bowel gas pattern is normal. Mild lumbar spine degenerative changes are noted. IMPRESSION: Satisfactory position of left ureteral stent. No stone fragments seen along the course of the stent. Electronically Signed   By: Richardean Sale M.D.   On: 10/17/2015 16:58   [place] Day Of Procedure  10/12/2015  CLINICAL DATA:  Pre-op left ureteral  stone EXAM: ABDOMEN - 1 VIEW COMPARISON:  Plain film of 10/06/2015. Most recent CT of 06/03/2015. FINDINGS: Non-obstructive bowel gas pattern. 7 mm calcification projecting over the proximal to mid left ureter, likely corresponding to the ureteric stone described on the prior exam. No new calcifications over the kidneys or expected course of the ureters. IMPRESSION: 7 mm left ureteric calculus. Electronically Signed   By: Abigail Miyamoto M.D.   On: 10/12/2015 08:12    Microbiology: Recent Results (from the past 240 hour(s))  Urine culture     Status: None (Preliminary result)   Collection Time: 10/17/15  3:59 PM  Result Value Ref Range Status   Specimen Description URINE, CLEAN CATCH  Final   Special Requests NONE  Final   Culture   Final    >=100,000 COLONIES/mL ENTEROCOCCUS SPECIES SUSCEPTIBILITIES TO FOLLOW Performed at [facility]    Report Status PENDING  Incomplete  Culture, blood (Routine X 2) w Reflex to ID Panel     Status: None (Preliminary result)   Collection Time: 10/18/15  3:03 PM  Result Value Ref Range Status   Specimen Description BLOOD RAC  Final   Special Requests   Final    BOTTLES DRAWN AEROBIC AND ANAEROBIC 10CC BOTH BOTTLES   Culture   Final    NO GROWTH 3 DAYS Performed at [facility]     Report Status PENDING  Incomplete  Culture, blood (Routine X 2) w Reflex to ID Panel     Status: None (Preliminary result)   Collection Time: 10/18/15  3:15 PM  Result Value Ref Range Status   Specimen Description BLOOD RAC  Final   Special Requests   Final    BOTTLES DRAWN AEROBIC AND ANAEROBIC 10CC BOTH BOTTLES   Culture   Final    NO GROWTH 3 DAYS Performed at [facility]    Report Status PENDING  Incomplete  Urine culture     Status: None   Collection Time: 10/18/15  6:33 PM  Result Value Ref Range Status   Specimen Description URINE, RANDOM  Final   Special Requests NONE  Final   Culture   Final    4,000 COLONIES/mL INSIGNIFICANT GROWTH Performed at [facility]    Report Status 10/20/2015 FINAL  Final     Labs: Basic Metabolic Panel:  Recent Labs Lab 10/18/15 0943 10/19/15 0443 10/20/15 0540 10/21/15 0355 10/22/15 0450  NA 133* 135 135 137 139  K 3.7 3.7 4.2 3.5 4.3  CL 99* 103 104 109 106  CO2 23 21* 24 22 25   GLUCOSE 237* 204* 219* 192* 174*  BUN 17 18 15 16 12   CREATININE 1.15 1.17 1.17 1.08 1.06  CALCIUM 9.4 8.5* 8.2* 8.0* 8.2*   Liver Function Tests:  Recent Labs Lab 10/17/15 1444 10/18/15 0943  AST 17 18  ALT 26 23  ALKPHOS 57 57  BILITOT 1.1 1.6*  PROT 7.2 6.6  ALBUMIN 4.2 3.8   No results for input(s): LIPASE, AMYLASE in the last 168 hours. No results for input(s): AMMONIA in the last 168 hours. CBC:  Recent Labs Lab 10/17/15 1444 10/18/15 0943 10/19/15 0443 10/20/15 0540 10/21/15 0400 10/22/15 0450  WBC 17.7* 26.9* 27.1* 21.4* 17.4* 14.3*  NEUTROABS 13.9* 22.3*  --   --   --   --   HGB 16.4 15.7 13.6 12.7* 13.2 13.0  HCT 47.7 45.2 39.9 38.2* 39.0 38.3*  MCV 90.7 88.1 89.7 90.5 89.7 88.7  PLT 268 197  187 209 239 256   Cardiac Enzymes: No results for input(s): CKTOTAL, CKMB, CKMBINDEX, TROPONINI in the last 168 hours. BNP: BNP (last 3 results) No results for input(s): BNP in the last 8760 hours.  ProBNP (last  3 results) No results for input(s): PROBNP in the last 8760 hours.  CBG:  Recent Labs Lab 10/20/15 2120 10/21/15 0832 10/21/15 1230 10/21/15 1722 10/22/15 0809  GLUCAP 292* 123* 157* 159* 139*

## 2015-10-23 LAB — GLUCOSE, CAPILLARY: GLUCOSE-CAPILLARY: 218 mg/dL — AB (ref 65–99)

## 2015-10-23 LAB — NOROVIRUS GROUP 1 & 2 BY PCR, STOOL
Norovirus 1 by PCR: NEGATIVE
Norovirus 2  by PCR: NEGATIVE

## 2015-10-23 LAB — CULTURE, BLOOD (ROUTINE X 2)
Culture: NO GROWTH
Culture: NO GROWTH

## 2015-10-23 MED ORDER — AMPICILLIN 250 MG PO CAPS: 250.0000 mg | ORAL_CAPSULE | Freq: Three times a day (TID) | ORAL | Status: DC

## 2015-10-27 LAB — URINE CULTURE

## 2015-10-28 ENCOUNTER — Telehealth (HOSPITAL_BASED_OUTPATIENT_CLINIC_OR_DEPARTMENT_OTHER): Payer: Self-pay | Admitting: Emergency Medicine

## 2015-10-28 DIAGNOSIS — N390 Urinary tract infection, site not specified: Secondary | ICD-10-CM | POA: Diagnosis not present

## 2015-10-28 NOTE — Telephone Encounter (Signed)
Post ED Visit - Positive Culture Follow-up  Culture report reviewed by antimicrobial stewardship pharmacist:  []  Elenor Quinones, Pharm.D. []  Heide Guile, Pharm.D., BCPS []  Parks Neptune, Pharm.D. []  Alycia Rossetti, Pharm.D., BCPS []  [place], Pharm.D., BCPS, AAHIVP []  Legrand Como, Pharm.D., BCPS, AAHIVP [x]  Milus Glazier, Pharm.D. []  Stephens November, Pharm.D.  Positive urine culture Enterococcus Treated with cephalexin, organism sensitive to the same and no further patient follow-up is required at this time.  Hazle Nordmann 10/28/2015, 9:19 AM

## 2015-11-20 DIAGNOSIS — Z6829 Body mass index (BMI) 29.0-29.9, adult: Secondary | ICD-10-CM | POA: Diagnosis not present

## 2015-11-20 DIAGNOSIS — N200 Calculus of kidney: Secondary | ICD-10-CM | POA: Diagnosis not present

## 2015-11-20 DIAGNOSIS — N390 Urinary tract infection, site not specified: Secondary | ICD-10-CM | POA: Diagnosis not present

## 2015-11-20 DIAGNOSIS — E119 Type 2 diabetes mellitus without complications: Secondary | ICD-10-CM | POA: Diagnosis not present

## 2015-11-20 DIAGNOSIS — I10 Essential (primary) hypertension: Secondary | ICD-10-CM | POA: Diagnosis not present

## 2015-12-08 DIAGNOSIS — E1165 Type 2 diabetes mellitus with hyperglycemia: Secondary | ICD-10-CM | POA: Diagnosis not present

## 2015-12-08 DIAGNOSIS — N289 Disorder of kidney and ureter, unspecified: Secondary | ICD-10-CM | POA: Diagnosis not present

## 2015-12-08 DIAGNOSIS — Z6830 Body mass index (BMI) 30.0-30.9, adult: Secondary | ICD-10-CM | POA: Diagnosis not present

## 2015-12-08 DIAGNOSIS — N390 Urinary tract infection, site not specified: Secondary | ICD-10-CM | POA: Diagnosis not present

## 2015-12-08 DIAGNOSIS — I10 Essential (primary) hypertension: Secondary | ICD-10-CM | POA: Diagnosis not present

## 2015-12-08 DIAGNOSIS — Z1389 Encounter for screening for other disorder: Secondary | ICD-10-CM | POA: Diagnosis not present

## 2015-12-14 DIAGNOSIS — H401131 Primary open-angle glaucoma, bilateral, mild stage: Secondary | ICD-10-CM | POA: Diagnosis not present

## 2016-01-15 ENCOUNTER — Encounter (HOSPITAL_COMMUNITY): Payer: Self-pay | Admitting: Emergency Medicine

## 2016-01-15 ENCOUNTER — Emergency Department (HOSPITAL_COMMUNITY)
Admission: EM | Admit: 2016-01-15 | Discharge: 2016-01-15 | Disposition: A | Discharge status: Home / Self Care | Payer: PPO | Attending: Emergency Medicine | Admitting: Emergency Medicine

## 2016-01-15 ENCOUNTER — Emergency Department (HOSPITAL_COMMUNITY): Payer: PPO

## 2016-01-15 DIAGNOSIS — W01198A Fall on same level from slipping, tripping and stumbling with subsequent striking against other object, initial encounter: Secondary | ICD-10-CM | POA: Insufficient documentation

## 2016-01-15 DIAGNOSIS — R51 Headache: Secondary | ICD-10-CM | POA: Diagnosis not present

## 2016-01-15 DIAGNOSIS — I10 Essential (primary) hypertension: Secondary | ICD-10-CM | POA: Insufficient documentation

## 2016-01-15 DIAGNOSIS — Y9289 Other specified places as the place of occurrence of the external cause: Secondary | ICD-10-CM | POA: Insufficient documentation

## 2016-01-15 DIAGNOSIS — R040 Epistaxis: Secondary | ICD-10-CM | POA: Diagnosis not present

## 2016-01-15 DIAGNOSIS — W19XXXA Unspecified fall, initial encounter: Secondary | ICD-10-CM

## 2016-01-15 DIAGNOSIS — Y998 Other external cause status: Secondary | ICD-10-CM | POA: Diagnosis not present

## 2016-01-15 DIAGNOSIS — Z87891 Personal history of nicotine dependence: Secondary | ICD-10-CM | POA: Insufficient documentation

## 2016-01-15 DIAGNOSIS — Y9301 Activity, walking, marching and hiking: Secondary | ICD-10-CM | POA: Diagnosis not present

## 2016-01-15 DIAGNOSIS — S0033XA Contusion of nose, initial encounter: Secondary | ICD-10-CM | POA: Diagnosis not present

## 2016-01-15 DIAGNOSIS — S022XXA Fracture of nasal bones, initial encounter for closed fracture: Secondary | ICD-10-CM | POA: Insufficient documentation

## 2016-01-15 DIAGNOSIS — S0993XA Unspecified injury of face, initial encounter: Secondary | ICD-10-CM | POA: Diagnosis not present

## 2016-01-15 DIAGNOSIS — Z7984 Long term (current) use of oral hypoglycemic drugs: Secondary | ICD-10-CM | POA: Insufficient documentation

## 2016-01-15 DIAGNOSIS — S0031XA Abrasion of nose, initial encounter: Secondary | ICD-10-CM | POA: Insufficient documentation

## 2016-01-15 DIAGNOSIS — S0081XA Abrasion of other part of head, initial encounter: Secondary | ICD-10-CM | POA: Insufficient documentation

## 2016-01-15 DIAGNOSIS — Z79899 Other long term (current) drug therapy: Secondary | ICD-10-CM | POA: Diagnosis not present

## 2016-01-15 MED ORDER — AMOXICILLIN-POT CLAVULANATE 875-125 MG PO TABS: 1.0000 | ORAL_TABLET | Freq: Two times a day (BID) | ORAL | Status: DC

## 2016-01-15 MED ORDER — TETANUS-DIPHTH-ACELL PERTUSSIS 5-2.5-18.5 LF-MCG/0.5 IM SUSP
0.5000 mL | Freq: Once | INTRAMUSCULAR | Status: DC
Start: 2016-01-15 — End: 2016-01-15
  Filled 2016-01-15: qty 0.5

## 2016-01-15 NOTE — ED Notes (Signed)
Patient transported to CT 

## 2016-01-15 NOTE — ED Provider Notes (Signed)
CSN: [id number]     Arrival date & time 01/15/16  1240 History  By signing my name below, I, Rayna Sexton, attest that this documentation has been prepared under the direction and in the presence of Lakshmi Sundeen C. Drue Camera, PA-C. Electronically Signed: Rayna Sexton, ED Scribe. 01/15/2016. 2:32 PM.   Chief Complaint  Patient presents with  . Fall  . Facial Injury   The history is provided by the patient and the spouse. No language interpreter was used.    HPI Comments: Brandon Morrow is a 70 y.o. male with a PMHx of HTN who presents to the Emergency Department complaining of a fall that occurred 3 hours ago. Pt was walking with a chainsaw which wasn't running, tripped on a small patch of brush and fell forwards with his face striking the plastic casing of the chainsaw. He reports associated, moderate, epistaxis that has since alleviated, small lacerations with controlled bleeding to the bridge of his nose, between his eyebrows and to his left lateral eyebrow as well as multiple mild abrasions and 3/10 pain throughout the affected region. Pt reports taking baby Asprin daily but denies being on any other anticoagulation. Pt denies LOC, n/v, incontinence of his bowels or bladder, visual disturbances, dizziness, neck/back pain, or any other associated symptoms at this time.    Past Medical History  Diagnosis Date  . Hypertension    Past Surgical History  Procedure Laterality Date  . Melanoma excision  2010    forearm  . Knee arthroscopy Bilateral 2000  . Cataract extraction w/ intraocular lens  implant, bilateral    . Colonoscopy  last one 10-07-2015  . Negative sleep study  YRS AGO per pt  . Cystoscopy with retrograde pyelogram, ureteroscopy and stent placement Left 10/12/2015    Procedure: LEFT RETROGRADE PYELOGRAM, LEFT URETEROSCOPY, LASER LITHOTRIPSY  AND STENT PLACEMENT;  Surgeon: Kathie Rhodes, MD;  Location: [facility];  Service: Urology;  Laterality: Left;  . Holmium laser  application Left 0000000    Procedure: HOLMIUM LASER APPLICATION;  Surgeon: Kathie Rhodes, MD;  Location: [facility];  Service: Urology;  Laterality: Left;   History reviewed. No pertinent family history. Social History  Substance Use Topics  . Smoking status: Former Smoker -- 20 years    Types: Cigarettes    Quit date: 10/08/1987  . Smokeless tobacco: Never Used  . Alcohol Use: Yes     Comment: rare    Review of Systems  HENT: Positive for facial swelling.        Facial abrasions and lacerations  Respiratory: Negative for shortness of breath.   Cardiovascular: Negative for chest pain.  Gastrointestinal: Negative for nausea, vomiting and abdominal pain.  Musculoskeletal: Negative for back pain and neck pain.  Skin: Positive for color change and wound.  Neurological: Negative for dizziness, syncope, weakness, light-headedness, numbness and headaches.  All other systems reviewed and are negative.   Allergies  Codeine; Oxycodone; and Sulfa antibiotics  Home Medications   Prior to Admission medications   Medication Sig Start Date End Date Taking? Authorizing Provider  acetaminophen (TYLENOL) 500 MG tablet Take 1,000 mg by mouth every 6 (six) hours as needed for moderate pain.    Historical Provider, MD  amLODipine (NORVASC) 5 MG tablet Take 5 mg by mouth at bedtime.  04/12/15   Historical Provider, MD  amoxicillin-clavulanate (AUGMENTIN) 875-125 MG tablet Take 1 tablet by mouth every 12 (twelve) hours. 01/15/16   Janaisa Birkland C Sonoma Firkus, PA-C  ampicillin (PRINCIPEN) 250  MG capsule Take 1 capsule (250 mg total) by mouth 3 (three) times daily. 10/23/15   Robbie Lis, MD  atorvastatin (LIPITOR) 10 MG tablet Take 5 mg by mouth every morning.  05/05/15   Historical Provider, MD  Cholecalciferol (VITAMIN D3) 400 UNITS CAPS Take 400 Units by mouth daily.    Historical Provider, MD  ibuprofen (ADVIL,MOTRIN) 200 MG tablet Take 400 mg by mouth daily as needed for headache, mild pain or  moderate pain.     Historical Provider, MD  latanoprost (XALATAN) 0.005 % ophthalmic solution Place 1 drop into both eyes every evening. 05/26/15   Historical Provider, MD  lisinopril-hydrochlorothiazide (PRINZIDE,ZESTORETIC) 20-12.5 MG per tablet Take 1 tablet by mouth every morning.  04/06/15   Historical Provider, MD  loratadine (CLARITIN) 10 MG tablet Take 10 mg by mouth daily.    Historical Provider, MD  metFORMIN (GLUCOPHAGE) 1000 MG tablet Take 1,000 mg by mouth 2 (two) times daily. 05/11/15   Historical Provider, MD  Multiple Vitamins-Minerals (MULTIVITAMIN ADULT PO) Take 1 tablet by mouth daily.    Historical Provider, MD  Omega-3 Fatty Acids (FISH OIL) 1200 MG CAPS Take 1,200 mg by mouth 2 (two) times daily.    Historical Provider, MD   BP 150/91 mmHg  Pulse 73  Temp(Src) 98.1 F (36.7 C) (Oral)  Resp 16  SpO2 98%    Physical Exam  Constitutional: He is oriented to person, place, and time. He appears well-developed and well-nourished. No distress.  HENT:  Head: Normocephalic and atraumatic.  Mouth/Throat: Oropharynx is clear and moist.  Dentition intact; dry blood present in bilateral nares; no active hemorrhage; bilateral bruising extending medially following the lower orbital rim from the nasal bridge; swelling bilaterally on nasal bridge with small abrasions. Subcutaneous emphysema present on the left side of the nasal bridge extending towards the left lower orbital rim.  Eyes: Conjunctivae and EOM are normal. Pupils are equal, round, and reactive to light.  Neck: Normal range of motion. Neck supple.  Cardiovascular: Normal rate, regular rhythm, normal heart sounds and intact distal pulses.   Pulmonary/Chest: Effort normal and breath sounds normal. No respiratory distress.  Abdominal: Soft. There is no tenderness. There is no guarding.  Musculoskeletal: Normal range of motion. He exhibits no edema or tenderness.  Full ROM in all extremities and spine. No paraspinal tenderness.    Lymphadenopathy:    He has no cervical adenopathy.  Neurological: He is alert and oriented to person, place, and time. He has normal reflexes.  No sensory deficits. Strength 5/5 in all extremities. No gait disturbance. Coordination intact. Cranial nerves III-XII grossly intact.   Skin: Skin is warm and dry. He is not diaphoretic.  1 cm superficial laceration in line with left eyebrow; edges well approximated with no active hemorrhage; small abrasion to forehead between eyebrows  Psychiatric: He has a normal mood and affect. His behavior is normal.  Nursing note and vitals reviewed.   ED Course  Procedures  DIAGNOSTIC STUDIES: Oxygen Saturation is 98% on RA, normal by my interpretation.    COORDINATION OF CARE: 2:26 PM Discussed next steps with pt. He verbalized understanding and is agreeable with the plan.   Labs Review Labs Reviewed - No data to display  Imaging Review Ct Head Wo Contrast  01/15/2016  CLINICAL DATA:  Patient fell striking face on chainsaw cover. Pain with epistaxis. No reported loss of consciousness. EXAM: CT HEAD WITHOUT CONTRAST CT MAXILLOFACIAL WITHOUT CONTRAST TECHNIQUE: Multidetector CT imaging of the head and  maxillofacial structures were performed using the standard protocol without intravenous contrast. Multiplanar CT image reconstructions of the maxillofacial structures were also generated. COMPARISON:  None. FINDINGS: CT HEAD FINDINGS No evidence for acute infarction, hemorrhage, mass lesion, hydrocephalus, or extra-axial fluid. No atrophy or white matter disease. Intact calvarium. CT MAXILLOFACIAL FINDINGS Comminuted nasal bone fractures. These are greater on the LEFT. There is mild to moderate subcutaneous emphysema over both malar regions, greater on the LEFT. No orbital emphysema. BILATERAL cataract extraction. No blowout injury. No radiopaque foreign body. Maxilla intact. Mandible intact. No posttraumatic missing teeth. Minor retention cyst formation in the  LEFT maxillary sinus. Mild BILATERAL turbinate hypertrophy but no frank nasal cavity clock. Mild nasal septal deviation LEFT-to-RIGHT of 2 3 mm. Cribriform plate intact. 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 IMPRESSION: Comminuted nasal bone fractures, greater on the LEFT. This is associated with subcutaneous emphysema, but no blowout injury or facial fracture of significance. Unremarkable CT head without contrast. Electronically Signed   By: Staci Righter M.D.   On: 01/15/2016 16:00   Ct Maxillofacial Wo Cm  01/15/2016  CLINICAL DATA:  Patient fell striking face on chainsaw cover. Pain with epistaxis. No reported loss of consciousness. EXAM: CT HEAD WITHOUT CONTRAST CT MAXILLOFACIAL WITHOUT CONTRAST TECHNIQUE: Multidetector CT imaging of the head and maxillofacial structures were performed using the standard protocol without intravenous contrast. Multiplanar CT image reconstructions of the maxillofacial structures were also generated. COMPARISON:  None. FINDINGS: CT HEAD FINDINGS No evidence for acute infarction, hemorrhage, mass lesion, hydrocephalus, or extra-axial fluid. No atrophy or white matter disease. Intact calvarium. CT MAXILLOFACIAL FINDINGS Comminuted nasal bone fractures. These are greater on the LEFT. There is mild to moderate subcutaneous emphysema over both malar regions, greater on the LEFT. No orbital emphysema. BILATERAL cataract extraction. No blowout injury. No radiopaque foreign body. Maxilla intact. Mandible intact. No posttraumatic missing teeth. Minor retention cyst formation in the LEFT maxillary sinus. Mild BILATERAL turbinate hypertrophy but no frank nasal cavity clock. Mild nasal septal deviation LEFT-to-RIGHT of 2 3 mm. Cribriform plate intact. 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 IMPRESSION: Comminuted nasal bone fractures, greater on the LEFT. This is associated with subcutaneous emphysema, but no blowout  injury or facial fracture of significance. Unremarkable CT head without contrast. Electronically Signed   By: Staci Righter M.D.   On: 01/15/2016 16:00   I have personally reviewed and evaluated these images as part of my medical decision-making.   EKG Interpretation None      MDM   Final diagnoses:  Fall, initial encounter  Nasal fracture, closed, initial encounter    Sydnee Cabal presents for evaluation following a trip and fall with facial trauma that occurred just prior to arrival.  Findings and plan of care discussed with Carmin Muskrat, MD. Dr. Vanita Panda personally evaluated and examined this patient.  Nares are patent no difficulty breathing. Patient has no neuro or functional deficits. No signs of serious head injury. CT shows comminuted nasal bone fracture. Patient to follow-up with ENT outpatient. Augmentin prescribed due to possible involvement of the maxillary sinuses. Repeat evaluation and neuro exam shows no changes. Home care and return precautions discussed. Patient voiced understanding of these instructions, accepts the plan, and is comfortable with discharge. Patient appears safe for discharge at this time.  I personally performed the services described in this documentation, which was scribed in my presence. The recorded information has been reviewed and is accurate.    Lorayne Bender,  PA-C 01/16/16 [id number]  Carmin Muskrat, MD 01/16/16 1736

## 2016-01-15 NOTE — Discharge Instructions (Signed)
You have been seen today for a fall and facial injury. Your imaging showed a complex nasal bone fracture. Follow-up with ENT as soon as possible.Please take all of your antibiotics until finished!   You may develop abdominal discomfort or diarrhea from the antibiotic.  You may help offset this with probiotics which you can buy or get in yogurt. Do not eat or take the probiotics until 2 hours after your antibiotic. Follow up with PCP as needed. Return to ED should symptoms worsen.

## 2016-01-15 NOTE — ED Notes (Signed)
Cleaned face wounds with water/chlorhexidine

## 2016-01-15 NOTE — ED Notes (Signed)
Pt reports he tripped over some brush this am and hit his nose on the plastic case of a chainsaw. No bleeding at present. Pt has shallow laceration under L eyebrow and abrasions no nose. Denies any other injuries. No LOC. Not on blood thinners. Pt's nose does appear uneven.

## 2016-01-20 DIAGNOSIS — S022XXD Fracture of nasal bones, subsequent encounter for fracture with routine healing: Secondary | ICD-10-CM | POA: Diagnosis not present

## 2016-04-06 DIAGNOSIS — N281 Cyst of kidney, acquired: Secondary | ICD-10-CM | POA: Diagnosis not present

## 2016-04-06 DIAGNOSIS — N200 Calculus of kidney: Secondary | ICD-10-CM | POA: Diagnosis not present

## 2016-05-12 DIAGNOSIS — I10 Essential (primary) hypertension: Secondary | ICD-10-CM | POA: Diagnosis not present

## 2016-05-12 DIAGNOSIS — N200 Calculus of kidney: Secondary | ICD-10-CM | POA: Diagnosis not present

## 2016-05-12 DIAGNOSIS — Z1389 Encounter for screening for other disorder: Secondary | ICD-10-CM | POA: Diagnosis not present

## 2016-05-12 DIAGNOSIS — N390 Urinary tract infection, site not specified: Secondary | ICD-10-CM | POA: Diagnosis not present

## 2016-05-12 DIAGNOSIS — E119 Type 2 diabetes mellitus without complications: Secondary | ICD-10-CM | POA: Diagnosis not present

## 2016-06-13 DIAGNOSIS — H401111 Primary open-angle glaucoma, right eye, mild stage: Secondary | ICD-10-CM | POA: Diagnosis not present

## 2016-06-13 DIAGNOSIS — H52203 Unspecified astigmatism, bilateral: Secondary | ICD-10-CM | POA: Diagnosis not present

## 2016-06-13 DIAGNOSIS — H401122 Primary open-angle glaucoma, left eye, moderate stage: Secondary | ICD-10-CM | POA: Diagnosis not present

## 2016-06-13 DIAGNOSIS — E119 Type 2 diabetes mellitus without complications: Secondary | ICD-10-CM | POA: Diagnosis not present

## 2016-07-09 DIAGNOSIS — Z23 Encounter for immunization: Secondary | ICD-10-CM | POA: Diagnosis not present

## 2016-08-16 DIAGNOSIS — Z125 Encounter for screening for malignant neoplasm of prostate: Secondary | ICD-10-CM | POA: Diagnosis not present

## 2016-08-16 DIAGNOSIS — E1165 Type 2 diabetes mellitus with hyperglycemia: Secondary | ICD-10-CM | POA: Diagnosis not present

## 2016-08-16 DIAGNOSIS — E784 Other hyperlipidemia: Secondary | ICD-10-CM | POA: Diagnosis not present

## 2016-08-16 DIAGNOSIS — I10 Essential (primary) hypertension: Secondary | ICD-10-CM | POA: Diagnosis not present

## 2016-08-25 DIAGNOSIS — I480 Paroxysmal atrial fibrillation: Secondary | ICD-10-CM | POA: Diagnosis not present

## 2016-08-25 DIAGNOSIS — H4089 Other specified glaucoma: Secondary | ICD-10-CM | POA: Diagnosis not present

## 2016-08-25 DIAGNOSIS — Z6830 Body mass index (BMI) 30.0-30.9, adult: Secondary | ICD-10-CM | POA: Diagnosis not present

## 2016-08-25 DIAGNOSIS — M1990 Unspecified osteoarthritis, unspecified site: Secondary | ICD-10-CM | POA: Diagnosis not present

## 2016-08-25 DIAGNOSIS — N390 Urinary tract infection, site not specified: Secondary | ICD-10-CM | POA: Diagnosis not present

## 2016-08-25 DIAGNOSIS — C439 Malignant melanoma of skin, unspecified: Secondary | ICD-10-CM | POA: Diagnosis not present

## 2016-08-25 DIAGNOSIS — Z1212 Encounter for screening for malignant neoplasm of rectum: Secondary | ICD-10-CM | POA: Diagnosis not present

## 2016-08-25 DIAGNOSIS — E784 Other hyperlipidemia: Secondary | ICD-10-CM | POA: Diagnosis not present

## 2016-08-25 DIAGNOSIS — J309 Allergic rhinitis, unspecified: Secondary | ICD-10-CM | POA: Diagnosis not present

## 2016-08-25 DIAGNOSIS — I10 Essential (primary) hypertension: Secondary | ICD-10-CM | POA: Diagnosis not present

## 2016-08-25 DIAGNOSIS — Z0000 Encounter for general adult medical examination without abnormal findings: Secondary | ICD-10-CM | POA: Diagnosis not present

## 2016-08-25 DIAGNOSIS — E1165 Type 2 diabetes mellitus with hyperglycemia: Secondary | ICD-10-CM | POA: Diagnosis not present

## 2016-08-25 DIAGNOSIS — D126 Benign neoplasm of colon, unspecified: Secondary | ICD-10-CM | POA: Diagnosis not present

## 2016-09-14 DIAGNOSIS — E1165 Type 2 diabetes mellitus with hyperglycemia: Secondary | ICD-10-CM | POA: Diagnosis not present

## 2016-09-14 DIAGNOSIS — I4891 Unspecified atrial fibrillation: Secondary | ICD-10-CM | POA: Diagnosis not present

## 2016-09-14 DIAGNOSIS — I10 Essential (primary) hypertension: Secondary | ICD-10-CM | POA: Diagnosis not present

## 2016-09-26 DIAGNOSIS — I10 Essential (primary) hypertension: Secondary | ICD-10-CM | POA: Diagnosis not present

## 2016-09-26 DIAGNOSIS — R9431 Abnormal electrocardiogram [ECG] [EKG]: Secondary | ICD-10-CM | POA: Diagnosis not present

## 2016-09-26 DIAGNOSIS — I4891 Unspecified atrial fibrillation: Secondary | ICD-10-CM | POA: Diagnosis not present

## 2016-09-29 DIAGNOSIS — L814 Other melanin hyperpigmentation: Secondary | ICD-10-CM | POA: Diagnosis not present

## 2016-09-29 DIAGNOSIS — Z85820 Personal history of malignant melanoma of skin: Secondary | ICD-10-CM | POA: Diagnosis not present

## 2016-09-29 DIAGNOSIS — D235 Other benign neoplasm of skin of trunk: Secondary | ICD-10-CM | POA: Diagnosis not present

## 2016-09-29 DIAGNOSIS — L821 Other seborrheic keratosis: Secondary | ICD-10-CM | POA: Diagnosis not present

## 2016-09-29 DIAGNOSIS — D1801 Hemangioma of skin and subcutaneous tissue: Secondary | ICD-10-CM | POA: Diagnosis not present

## 2016-09-30 DIAGNOSIS — E119 Type 2 diabetes mellitus without complications: Secondary | ICD-10-CM | POA: Diagnosis not present

## 2016-09-30 DIAGNOSIS — Z6830 Body mass index (BMI) 30.0-30.9, adult: Secondary | ICD-10-CM | POA: Diagnosis not present

## 2016-09-30 DIAGNOSIS — E1165 Type 2 diabetes mellitus with hyperglycemia: Secondary | ICD-10-CM | POA: Diagnosis not present

## 2016-09-30 DIAGNOSIS — I480 Paroxysmal atrial fibrillation: Secondary | ICD-10-CM | POA: Diagnosis not present

## 2016-10-06 DIAGNOSIS — I4891 Unspecified atrial fibrillation: Secondary | ICD-10-CM | POA: Diagnosis not present

## 2016-10-06 DIAGNOSIS — I10 Essential (primary) hypertension: Secondary | ICD-10-CM | POA: Diagnosis not present

## 2016-10-06 DIAGNOSIS — R9431 Abnormal electrocardiogram [ECG] [EKG]: Secondary | ICD-10-CM | POA: Diagnosis not present

## 2016-10-18 DIAGNOSIS — E1165 Type 2 diabetes mellitus with hyperglycemia: Secondary | ICD-10-CM | POA: Diagnosis not present

## 2016-10-18 DIAGNOSIS — I10 Essential (primary) hypertension: Secondary | ICD-10-CM | POA: Diagnosis not present

## 2016-10-18 DIAGNOSIS — I420 Dilated cardiomyopathy: Secondary | ICD-10-CM | POA: Diagnosis not present

## 2016-10-18 DIAGNOSIS — I4891 Unspecified atrial fibrillation: Secondary | ICD-10-CM | POA: Diagnosis not present

## 2016-10-22 DIAGNOSIS — I480 Paroxysmal atrial fibrillation: Secondary | ICD-10-CM | POA: Diagnosis present

## 2016-10-22 NOTE — H&P (Signed)
OFFICE VISIT NOTES COPIED TO EPIC FOR DOCUMENTATION  . History of Present Illness Brandon Page MD; 2016/10/28 7:35 PM) Patient words: Last O/V 09/14/2016; F/U nuc and echo results for afib, abn ekg, htn.  The patient is a 70 year old male who presents for a Follow-up for Atrial fibrillation. He has a history of uncontrolled diabetes, HTN, and HLD. He recently saw his PCP for routine evaluation and an EKG was performed, incidentally revealing a.fib on 08/25/2016 with ventricular rate at 95/min. He had denied any symptoms at that time. He was started on Eliquis. I had discontinued amlodipine and started him on metoprolol which is tolerating. He is accompanied by his wife at the bedside.  Patient's wife does admit that patient snores heavily throughout the night. Patient states that he has no daytime somnolence, many years ago he has had a sleep study which was negative for sleep apnea. No other symptoms to suggest obstructive sleep apnea. Again denies any symptoms like fatigue, palpitations, dizziness, syncope.  He underwent nuclear stress test on 09/26/2016 revealing no evidence of ischemia, myocardial markedly reduced LVEF at 40-45% and similarly echocardiogram had revealed mild to moderate decrease in LVEF at 40-45% with moderate left atrial enlargement.   Problem List/Past Medical Brandon Morrow; 10-28-16 3:13 PM) New onset atrial fibrillation (I48.91)  CHA2DS2-VASc Score is 3 with yearly risk of stroke of 3.2%. HAS-Bled score is 1 and estimated major bleeding in one year is 1.02-1.5%. Exercise sestamibi stress test 09/26/2016: 1. The resting electrocardiogram demonstrated atrial fibrillation, normal resting conduction and normal rest repolarization. The stress electrocardiogram was normal. Patient exercised on Bruce protocol for 5:02 minutes and achieved 4.64 METS. Stress test terminated due to 124% MPHR achieved (Target HR >85%), no symptoms. 2. The perfusion imaging study  demonstrates mild diaphragmatic attenuation artifact, no evidence of ischemia or scar. LV systolic function calculated by QGS was mild to moderately depressed at 41% with global hypokinesis. Findings may suggest nonischemic cardiomyopathy. This is an intermediate risk study. Uncontrolled type 2 diabetes mellitus without complication, without long-term current use of insulin (E11.65)  Benign essential hypertension (I10)  Labwork  08/16/2016: glucose 220, creatinine 1.2, potassium 4.7, CMP otherwise normal, CBC normal, total cholesterol 140, triglycerides 170, HDL 34, LDL 72, ApoB 88, PSA normal, HbA1c 8.4% Hyperlipidemia, group A (E78.00)   Allergies (Brandon Morrow; October 28, 2016 3:13 PM) Sulfa Antibiotics  Rash. Codeine and Related  Itching.  Family History Brandon Morrow; 2016-10-28 3:13 PM) Father  Deceased. at age 68 from Parkinson's Disease complications; no heart attacks or strokes, HTN, h/o of heart cath with balloon angioplasty but no other known cardiovascular conditions Mother  Deceased. at age 70 from breast cancer; no heart attack or strokes, mild dm,htn, but no other cardiovascular conditions Siblings  3 sisters:2 deceased 1 brother deceased from electrocution from power line  Social History (Brandon Morrow; 10/28/16 3:13 PM) Alcohol Use  Occasional alcohol use. Current tobacco use  Former smoker. quit 1988 Marital status  Married. Number of Children  2. Living Situation  Lives with spouse.  Past Surgical History Brandon Morrow; 10-28-16 3:13 PM) Arthroscopic Knee Surgery - Left 2002 Arthroscopic Knee Surgery - Right 2005 Cataract Extraction-Bilateral 2007 Nephrolithotomy, Removal of Calculi 09/2015  Medication History (Brandon Morrow; Oct 28, 2016 3:21 PM) Metoprolol Tartrate (25MG  Tablet, 1 (one) Tablet Oral two times daily, Taken starting 10/10/2016) Active. (Discontinue Amlodipine) Atorvastatin Calcium (10MG  Tablet, 1/2 Oral daily)  Active. Eliquis (5MG  Tablet, 1 Oral two times daily) Active. Latanoprost (0.005% Solution, 1 drop each eye Ophthalmic  at bedtime) Active. Lisinopril-Hydrochlorothiazide (20-12.5MG  Tablet, 1 Oral daily) Active. MetFORMIN HCl (1000MG  Tablet, 1 Oral two times daily) Active. Fish Oil (1000MG  Capsule DR, 1 Oral two times daily) Active. Vitamin D3 (400UNIT Capsule, 1 Oral at bedtime) Active. Spectravite Multivitamin (1 daily) Active. Loratadine (10MG  Tablet, 1 Oral at bedtime) Active. Vitamin C (250MG  Tablet, 1 Oral daily) Discontinued: Patient Choice. Medications Reconciled (verbally with pt; list present)  Diagnostic Studies History Brandon Morrow; 10/18/2016 3:13 PM) Echocardiogram 10/05/2016 1. Left ventricle cavity is normal in size. Mild concentric hypertrophy of the left ventricle. Moderate decrease in global wall motion. Visual EF is 40-45%. 2. Left atrial cavity is moderately dilated. 3. Mild to moderate mitral regurgitation. 4. Mild tricuspid regurgitation. No evidence of pulmonary hypertension. Treadmill stress test 09/26/2016 1. The resting electrocardiogram demonstrated atrial fibrillation, normal resting conduction and normal rest repolarization. The stress electrocardiogram was normal. Patient exercised on Bruce protocol for 5:02 minutes and achieved 4.64 METS. Stress test terminated due to 124% MPHR achieved (Target HR >85%), no symptoms. 2. The perfusion imaging study demonstrates mild diaphragmatic attenuation artifact, no evidence of ischemia or scar. LV systolic function calculated by QGS was mild to moderately depressed at 41% with global hypokinesis. Findings may suggest nonischemic cardiomyopathy. This is an intermediate risk study. Labwork  08/16/2016: glucose 220, creatinine 1.2, potassium 4.7, CMP otherwise normal, CBC normal, total cholesterol 140, triglycerides 170, HDL 34, LDL 72, ApoB 88, PSA normal, HbA1c 8.4%    Review of Systems Brandon Page, MD;  10/18/2016 7:30 PM) General Not Present- Anorexia, Fatigue and Fever. Respiratory Present- Snoring. Not Present- Cough, Wakes up from Sleep Wheezing or Short of Breath and Wheezing. Cardiovascular Present- Night Cramps. Not Present- Claudications, Edema, Orthopnea and Paroxysmal Nocturnal Dyspnea. Gastrointestinal Not Present- Black, Tarry Stool, Change in Bowel Habits and Nausea. Neurological Not Present- Focal Neurological Symptoms and Syncope. Endocrine Not Present- Cold Intolerance, Excessive Sweating, Heat Intolerance and Thyroid Problems. Hematology Not Present- Anemia, Easy Bruising, Petechiae and Prolonged Bleeding.  Vitals Brandon Morrow; 10/18/2016 3:25 PM) 10/18/2016 3:22 PM Weight: 202.19 lb Height: 69in Body Surface Area: 2.08 m Body Mass Index: 29.86 kg/m  Pulse: 90 (Regular)  P.OX: 97% (Room air) BP: 138/84 (Sitting, Left Arm, Standard)       Physical Exam Brandon Page, MD; 10/18/2016 7:30 PM) General Mental Status-Alert. General Appearance-Cooperative and Appears stated age. Build & Nutrition-Well built.  Head and Neck Thyroid Gland Characteristics - normal size and consistency and no palpable nodules.  Chest and Lung Exam Chest and lung exam reveals -quiet, even and easy respiratory effort with no use of accessory muscles, non-tender and on auscultation, normal breath sounds, no adventitious sounds.  Cardiovascular Cardiovascular examination reveals -carotid auscultation reveals no bruits, abdominal aorta auscultation reveals no bruits and no prominent pulsation, femoral artery auscultation bilaterally reveals normal pulses, no bruits, no thrills, normal pedal pulses bilaterally and no digital clubbing, cyanosis, edema, increased warmth or tenderness. Auscultation Rhythm - Irregular. Heart Sounds - S1 is variable and S2 WNL. Murmurs & Other Heart Sounds - Normal exam - No Murmurs.  Abdomen Inspection Hernias - Ventral hernia -  Reducible. Palpation/Percussion Normal exam - Non Tender and No hepatosplenomegaly.  Neurologic Neurologic evaluation reveals -alert and oriented x 3 with no impairment of recent or remote memory. Motor-Grossly intact without any focal deficits.  Musculoskeletal Global Assessment Left Lower Extremity - no deformities, masses or tenderness, no known fractures. Right Lower Extremity - no deformities, masses or tenderness, no known fractures.  Assessment & Plan Brandon Page MD; 10/18/2016 7:38 PM) New onset atrial fibrillation (I48.91) Story: CHA2DS2-VASc Score is 3 with yearly risk of stroke of 3.2%. HAS-Bled score is 1 and estimated major bleeding in one year is 1.02-1.5%.  Exercise sestamibi stress test 09/26/2016: 1. The resting electrocardiogram demonstrated atrial fibrillation, normal resting conduction and normal rest repolarization. The stress electrocardiogram was normal. Patient exercised on Bruce protocol for 5:02 minutes and achieved 4.64 METS. Stress test terminated due to 124% MPHR achieved (Target HR >85%), no symptoms. 2. The perfusion imaging study demonstrates mild diaphragmatic attenuation artifact, no evidence of ischemia or scar. LV systolic function calculated by QGS was mild to moderately depressed at 41% with global hypokinesis. Findings may suggest nonischemic cardiomyopathy. This is an intermediate risk study. Impression: EKG 10/18/2016 Atrial fibrillation with controlled ventricular response at the rate of 87 bpm, normal axis. No evidence of ischemia. Normal QT interval. No significant change from EKG 09/14/2016: Atrial fibrillation with RVR, V- rate 100 bpm. Current Plans Complete electrocardiogram (93000) Started Multaq 400MG , 1 (one) Tablet two times daily, #60, 10/18/2016, Ref. x2. 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 Left ventricle cavity is normal in size. Mild concentric hypertrophy of the left ventricle. Moderate decrease in global wall motion. Visual EF is 40-45%. 2. Left atrial cavity is moderately dilated at 4.7 cm.. 3. Mild to moderate mitral regurgitation. 4. Mild tricuspid regurgitation. No evidence of pulmonary hypertension. Benign essential hypertension (I10)   Current Plans Mechanism of underlying disease process and action of medications discussed with the patient. I discussed primary/secondary prevention and also dietary counseling was done. I have reviewed the results of the echocardiogram and the stress test with the patient and we discussed regarding non-ischemic cardiomyopathy and although asymptomatic with regard to atrial fibrillation, I have recommended aggressive approach to treating his atrial fibrillation due to mild to moderate reduction in LVEF. I'll start the patient on Multac 400 mg by mouth twice a day, set him up for direct current cardioversion.  Schedule for Direct current cardioversion. I have discussed regarding risks benefits rate control vs rhythm control with the patient. Patient understands cardiac arrest and need for CPR, aspiration pneumonia, but not limited to these. Patient is willing. It will be scheduled for in the next week to two. Office visit after the tests.  CC Dr. Berneta Sages.    Signed by Brandon Page, MD (10/18/2016 7:38 PM)

## 2016-10-25 ENCOUNTER — Ambulatory Visit (HOSPITAL_COMMUNITY): Payer: PPO | Admitting: Anesthesiology

## 2016-10-25 ENCOUNTER — Encounter (HOSPITAL_COMMUNITY): Payer: Self-pay | Admitting: Anesthesiology

## 2016-10-25 ENCOUNTER — Encounter (HOSPITAL_COMMUNITY)
Admission: RE | Disposition: A | Discharge status: Home / Self Care | Payer: Self-pay | Source: Ambulatory Visit | Attending: Cardiology

## 2016-10-25 ENCOUNTER — Ambulatory Visit (HOSPITAL_COMMUNITY)
Admission: RE | Admit: 2016-10-25 | Discharge: 2016-10-25 | Disposition: A | Discharge status: Home / Self Care | Payer: PPO | Source: Ambulatory Visit | Attending: Cardiology | Admitting: Cardiology

## 2016-10-25 DIAGNOSIS — I429 Cardiomyopathy, unspecified: Secondary | ICD-10-CM | POA: Diagnosis not present

## 2016-10-25 DIAGNOSIS — E1165 Type 2 diabetes mellitus with hyperglycemia: Secondary | ICD-10-CM | POA: Insufficient documentation

## 2016-10-25 DIAGNOSIS — I10 Essential (primary) hypertension: Secondary | ICD-10-CM | POA: Insufficient documentation

## 2016-10-25 DIAGNOSIS — Z7984 Long term (current) use of oral hypoglycemic drugs: Secondary | ICD-10-CM | POA: Diagnosis not present

## 2016-10-25 DIAGNOSIS — Z87891 Personal history of nicotine dependence: Secondary | ICD-10-CM | POA: Diagnosis not present

## 2016-10-25 DIAGNOSIS — I4891 Unspecified atrial fibrillation: Secondary | ICD-10-CM | POA: Insufficient documentation

## 2016-10-25 DIAGNOSIS — I480 Paroxysmal atrial fibrillation: Secondary | ICD-10-CM | POA: Diagnosis present

## 2016-10-25 HISTORY — PX: CARDIOVERSION: SHX1299

## 2016-10-25 LAB — GLUCOSE, CAPILLARY: GLUCOSE-CAPILLARY: 120 mg/dL — AB (ref 65–99)

## 2016-10-25 SURGERY — CARDIOVERSION
Anesthesia: General

## 2016-10-25 MED ORDER — LIDOCAINE 2% (20 MG/ML) 5 ML SYRINGE
INTRAMUSCULAR | Status: DC | PRN
  Administered 2016-10-25: 40 mg via INTRAVENOUS

## 2016-10-25 MED ORDER — SODIUM CHLORIDE 0.9 % IV SOLN
INTRAVENOUS | Status: DC | PRN
  Administered 2016-10-25: 12:00:00 via INTRAVENOUS

## 2016-10-25 MED ORDER — PROPOFOL 10 MG/ML IV BOLUS
INTRAVENOUS | Status: DC | PRN
  Administered 2016-10-25: 20 mg via INTRAVENOUS
  Administered 2016-10-25: 60 mg via INTRAVENOUS

## 2016-10-25 NOTE — Discharge Instructions (Signed)
Electrical Cardioversion, Care After °This sheet gives you information about how to care for yourself after your procedure. Your health care provider may also give you more specific instructions. If you have problems or questions, contact your health care provider. °What can I expect after the procedure? °After the procedure, it is common to have: °· Some redness on the skin where the shocks were given. °Follow these instructions at home: °· Do not drive for 24 hours if you were given a medicine to help you relax (sedative). °· Take over-the-counter and prescription medicines only as told by your health care provider. °· Ask your health care provider how to check your pulse. Check it often. °· Rest for 48 hours after the procedure or as told by your health care provider. °· Avoid or limit your caffeine use as told by your health care provider. °Contact a health care provider if: °· You feel like your heart is beating too quickly or your pulse is not regular. °· You have a serious muscle cramp that does not go away. °Get help right away if: °· You have discomfort in your chest. °· You are dizzy or you feel faint. °· You have trouble breathing or you are short of breath. °· Your speech is slurred. °· You have trouble moving an arm or leg on one side of your body. °· Your fingers or toes turn cold or blue. °This information is not intended to replace advice given to you by your health care provider. Make sure you discuss any questions you have with your health care provider. °Document Released: 07/31/2013 Document Revised: 05/13/2016 Document Reviewed: 04/15/2016 °Elsevier Interactive Patient Education © 2017 [employer] ° °

## 2016-10-25 NOTE — Transfer of Care (Signed)
Immediate Anesthesia Transfer of Care Note  Patient: Brandon Morrow  Procedure(s) Performed: Procedure(s): CARDIOVERSION (N/A)  Patient Location: PACU and Endoscopy Unit  Anesthesia Type:MAC  Level of Consciousness: awake, alert , oriented and patient cooperative  Airway & Oxygen Therapy: Patient Spontanous Breathing and Patient connected to nasal cannula oxygen  Post-op Assessment: Report given to RN, Post -op Vital signs reviewed and stable and Patient moving all extremities  Post vital signs: Reviewed and stable  Last Vitals:  Vitals:   10/25/16 1252 10/25/16 1253  BP:  (!) 146/127  Pulse: 77 77  Resp: (!) 22 (!) 24  Temp:      Last Pain:  Vitals:   10/25/16 1150  TempSrc: Oral         Complications: No apparent anesthesia complications

## 2016-10-25 NOTE — CV Procedure (Signed)
Direct current cardioversion:  Indication symptomatic A. Fibrillation.  Procedure: Using 60 mg of IV Propofol and 60 IV Lidocaine (for reducing venous pain) for achieving deep sedation, synchronized direct current cardioversion performed. Patient was delivered with 100 Joules of electricity X 1 with success to NSR. Patient tolerated the procedure well. No immediate complication noted.

## 2016-10-25 NOTE — Interval H&P Note (Signed)
History and Physical Interval Note:  10/25/2016 12:41 PM  Brandon Morrow  has presented today for surgery, with the diagnosis of AFIB  The various methods of treatment have been discussed with the patient and family. After consideration of risks, benefits and other options for treatment, the patient has consented to  Procedure(s): CARDIOVERSION (N/A) as a surgical intervention .  The patient's history has been reviewed, patient examined, no change in status, stable for surgery.  I have reviewed the patient's chart and labs.  Questions were answered to the patient's satisfaction.     Adrian Prows

## 2016-10-25 NOTE — Anesthesia Postprocedure Evaluation (Signed)
Anesthesia Post Note  Patient: Brandon Morrow  Procedure(s) Performed: Procedure(s) (LRB): CARDIOVERSION (N/A)  Patient location during evaluation: Endoscopy Anesthesia Type: General Level of consciousness: awake and alert Pain management: pain level controlled Vital Signs Assessment: post-procedure vital signs reviewed and stable Respiratory status: spontaneous breathing, nonlabored ventilation, respiratory function stable and patient connected to nasal cannula oxygen Cardiovascular status: blood pressure returned to baseline and stable Postop Assessment: no signs of nausea or vomiting Anesthetic complications: no       Last Vitals:  Vitals:   10/25/16 1315 10/25/16 1325  BP: (!) 144/81 (!) 147/86  Pulse: 70 72  Resp: 20 (!) 21  Temp:      Last Pain:  Vitals:   10/25/16 1255  TempSrc: Oral                 [name]

## 2016-10-25 NOTE — Anesthesia Preprocedure Evaluation (Addendum)
Anesthesia Evaluation  Patient identified by MRN, date of birth, ID band Patient awake    Reviewed: Allergy & Precautions, NPO status , Patient's Chart, lab work & pertinent test results  Airway Mallampati: II  TM Distance: >3 FB Neck ROM: Full    Dental  (+) Dental Advisory Given, Teeth Intact   Pulmonary former smoker,    breath sounds clear to auscultation       Cardiovascular hypertension, Pt. on medications + dysrhythmias Atrial Fibrillation  Rhythm:Irregular Rate:Abnormal     Neuro/Psych negative neurological ROS  negative psych ROS   GI/Hepatic negative GI ROS, Neg liver ROS,   Endo/Other  diabetes, Type 2, Oral Hypoglycemic Agents  Renal/GU negative Renal ROS  negative genitourinary   Musculoskeletal negative musculoskeletal ROS (+)   Abdominal   Peds negative pediatric ROS (+)  Hematology negative hematology ROS (+)   Anesthesia Other Findings   Reproductive/Obstetrics negative OB ROS                            Lab Results  Component Value Date   WBC 14.3 (H) 10/22/2015   HGB 13.0 10/22/2015   HCT 38.3 (L) 10/22/2015   MCV 88.7 10/22/2015   PLT 256 10/22/2015   Lab Results  Component Value Date   CREATININE 1.06 10/22/2015   BUN 12 10/22/2015   NA 139 10/22/2015   K 4.3 10/22/2015   CL 106 10/22/2015   CO2 25 10/22/2015   Lab Results  Component Value Date   INR 1.18 10/18/2015    Anesthesia Physical Anesthesia Plan  ASA: III  Anesthesia Plan: General   Post-op Pain Management:    Induction: Intravenous  Airway Management Planned: Mask  Additional Equipment:   Intra-op Plan:   Post-operative Plan:   Informed Consent: I have reviewed the patients History and Physical, chart, labs and discussed the procedure including the risks, benefits and alternatives for the proposed anesthesia with the patient or authorized representative who has indicated his/her  understanding and acceptance.     Plan Discussed with: CRNA  Anesthesia Plan Comments:         Anesthesia Quick Evaluation

## 2016-10-26 ENCOUNTER — Encounter (HOSPITAL_COMMUNITY): Payer: Self-pay | Admitting: Cardiology

## 2016-10-28 DIAGNOSIS — E784 Other hyperlipidemia: Secondary | ICD-10-CM | POA: Diagnosis not present

## 2016-10-28 DIAGNOSIS — E1165 Type 2 diabetes mellitus with hyperglycemia: Secondary | ICD-10-CM | POA: Diagnosis not present

## 2016-10-28 DIAGNOSIS — Z6830 Body mass index (BMI) 30.0-30.9, adult: Secondary | ICD-10-CM | POA: Diagnosis not present

## 2016-10-28 DIAGNOSIS — Z7901 Long term (current) use of anticoagulants: Secondary | ICD-10-CM | POA: Diagnosis not present

## 2016-10-28 DIAGNOSIS — I480 Paroxysmal atrial fibrillation: Secondary | ICD-10-CM | POA: Diagnosis not present

## 2016-10-28 DIAGNOSIS — I10 Essential (primary) hypertension: Secondary | ICD-10-CM | POA: Diagnosis not present

## 2016-12-08 DIAGNOSIS — Z1389 Encounter for screening for other disorder: Secondary | ICD-10-CM | POA: Diagnosis not present

## 2016-12-08 DIAGNOSIS — Z6830 Body mass index (BMI) 30.0-30.9, adult: Secondary | ICD-10-CM | POA: Diagnosis not present

## 2016-12-08 DIAGNOSIS — M1990 Unspecified osteoarthritis, unspecified site: Secondary | ICD-10-CM | POA: Diagnosis not present

## 2016-12-08 DIAGNOSIS — Z23 Encounter for immunization: Secondary | ICD-10-CM | POA: Diagnosis not present

## 2016-12-08 DIAGNOSIS — I10 Essential (primary) hypertension: Secondary | ICD-10-CM | POA: Diagnosis not present

## 2016-12-08 DIAGNOSIS — I480 Paroxysmal atrial fibrillation: Secondary | ICD-10-CM | POA: Diagnosis not present

## 2016-12-08 DIAGNOSIS — Z7901 Long term (current) use of anticoagulants: Secondary | ICD-10-CM | POA: Diagnosis not present

## 2016-12-08 DIAGNOSIS — E784 Other hyperlipidemia: Secondary | ICD-10-CM | POA: Diagnosis not present

## 2016-12-08 DIAGNOSIS — E1165 Type 2 diabetes mellitus with hyperglycemia: Secondary | ICD-10-CM | POA: Diagnosis not present

## 2016-12-12 DIAGNOSIS — H401122 Primary open-angle glaucoma, left eye, moderate stage: Secondary | ICD-10-CM | POA: Diagnosis not present

## 2017-01-16 DIAGNOSIS — R0601 Orthopnea: Secondary | ICD-10-CM | POA: Diagnosis not present

## 2017-01-16 DIAGNOSIS — I480 Paroxysmal atrial fibrillation: Secondary | ICD-10-CM | POA: Diagnosis not present

## 2017-01-16 DIAGNOSIS — I10 Essential (primary) hypertension: Secondary | ICD-10-CM | POA: Diagnosis not present

## 2017-01-16 DIAGNOSIS — I509 Heart failure, unspecified: Secondary | ICD-10-CM | POA: Diagnosis not present

## 2017-01-16 DIAGNOSIS — E1165 Type 2 diabetes mellitus with hyperglycemia: Secondary | ICD-10-CM | POA: Diagnosis not present

## 2017-01-16 DIAGNOSIS — R0609 Other forms of dyspnea: Secondary | ICD-10-CM | POA: Diagnosis not present

## 2017-01-17 ENCOUNTER — Other Ambulatory Visit: Payer: Self-pay | Admitting: Internal Medicine

## 2017-01-17 ENCOUNTER — Telehealth (HOSPITAL_COMMUNITY): Payer: Self-pay | Admitting: Internal Medicine

## 2017-01-17 DIAGNOSIS — I10 Essential (primary) hypertension: Secondary | ICD-10-CM

## 2017-01-17 DIAGNOSIS — I480 Paroxysmal atrial fibrillation: Secondary | ICD-10-CM

## 2017-01-17 DIAGNOSIS — I509 Heart failure, unspecified: Secondary | ICD-10-CM

## 2017-01-18 DIAGNOSIS — R0609 Other forms of dyspnea: Secondary | ICD-10-CM | POA: Diagnosis not present

## 2017-01-18 DIAGNOSIS — I480 Paroxysmal atrial fibrillation: Secondary | ICD-10-CM | POA: Diagnosis not present

## 2017-01-18 DIAGNOSIS — I10 Essential (primary) hypertension: Secondary | ICD-10-CM | POA: Diagnosis not present

## 2017-01-18 DIAGNOSIS — I509 Heart failure, unspecified: Secondary | ICD-10-CM | POA: Diagnosis not present

## 2017-01-18 NOTE — Telephone Encounter (Signed)
01/17/2017 01:31 PM Phone ([place]) [place], Burgher B (Self) [phone] (H)   Left Message - Called pt and lmsg for him to CB to get scheduled for echo.    By Verdene Rio

## 2017-01-24 ENCOUNTER — Other Ambulatory Visit: Payer: Self-pay | Admitting: Cardiology

## 2017-01-24 DIAGNOSIS — I5043 Acute on chronic combined systolic (congestive) and diastolic (congestive) heart failure: Secondary | ICD-10-CM | POA: Diagnosis not present

## 2017-01-24 DIAGNOSIS — I481 Persistent atrial fibrillation: Secondary | ICD-10-CM | POA: Diagnosis not present

## 2017-01-24 DIAGNOSIS — I420 Dilated cardiomyopathy: Secondary | ICD-10-CM | POA: Diagnosis not present

## 2017-01-24 DIAGNOSIS — R0609 Other forms of dyspnea: Secondary | ICD-10-CM | POA: Diagnosis not present

## 2017-01-24 DIAGNOSIS — I480 Paroxysmal atrial fibrillation: Secondary | ICD-10-CM

## 2017-01-24 DIAGNOSIS — I10 Essential (primary) hypertension: Secondary | ICD-10-CM

## 2017-01-24 DIAGNOSIS — I509 Heart failure, unspecified: Secondary | ICD-10-CM

## 2017-01-25 DIAGNOSIS — I481 Persistent atrial fibrillation: Secondary | ICD-10-CM | POA: Diagnosis not present

## 2017-01-29 DIAGNOSIS — I420 Dilated cardiomyopathy: Secondary | ICD-10-CM | POA: Diagnosis present

## 2017-01-29 DIAGNOSIS — I428 Other cardiomyopathies: Secondary | ICD-10-CM | POA: Diagnosis present

## 2017-01-29 NOTE — H&P (Signed)
OFFICE VISIT NOTES COPIED TO EPIC FOR DOCUMENTATION  . History of Present Illness Brandon Page MD; 02/13/2017 5:26 PM) Patient words: Last OV 11/02/2016; FU for edema, pt states he was told by his PCP that he has CHF.  The patient is a 71 year old male who presents for a Follow-up for Atrial fibrillation. He has a history of uncontrolled diabetes, HTN, and HLD.He was noted to have incidental atrial fibrillation on 08/25/2016 with controlled ventricular rate at 95/min. He had denied any symptoms at that time. He was started on Eliquis. Patient underwent direct current cardioversion on 10/25/2016, however on follow-up at 10 days, he was back in atrial fibrillation. Initially he denied any symptoms, and after discussions it was decided upon rate control strategy only. However over the past 2 months, he is gradually noticed worsening dyspnea on exertion, weight gain, severe orthopnea and PND. He was started on furosemide with 10 pound weight loss over the past 2 weeks, still feels markedly dyspneic even with minimal activities but much improved. Also feels very tired. He is accompanied by his wife at the bedside.  He underwent nuclear stress test on 09/26/2016 revealing no evidence of ischemia, myocardial markedly reduced LVEF at 41% and similarly echocardiogram had revealed mild to moderate decrease in LVEF at 40-45% with moderate left atrial enlargement.   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 Left ventricle cavity is normal in  size. Mild concentric hypertrophy of the left ventricle. Moderate decrease in global wall motion. Visual EF is 40-45%. 2. Left atrial cavity is moderately dilated at 4.7 cm.. 3. Mild to moderate mitral regurgitation. 4. Mild tricuspid regurgitation. No evidence of pulmonary hypertension. Hyperlipidemia, group A (E78.00)  New onset atrial fibrillation (I48.91) 08/25/2016 CHA2DS2-VASc Score is 3 with yearly risk of stroke of 3.2%. HAS-Bled score is 1 and estimated major bleeding in one year is 1.02-1.5%. Direct current cardioversion 10/25/2016: A. Fib to NST with 100 J x 1.  Allergies [name]; Feb 13, 2017 3:01 PM) Sulfa Antibiotics  Rash. Codeine and Related  Itching.  Family History [name]; 2017/02/13 3:01 PM) Father  Deceased. at age 54 from Parkinson's Disease complications; no heart attacks or strokes, HTN, h/o of heart cath with balloon angioplasty but no other known cardiovascular conditions Mother  Deceased. at age 68 from breast cancer; no heart attack or strokes, mild dm,htn, but no other cardiovascular conditions Siblings  3 sisters:2 deceased 1 brother deceased from electrocution from power line  Social History [name]; 2017/02/13 3:01 PM) Alcohol Use  Occasional alcohol use. Current tobacco use  Former smoker. quit 1988 Marital status  Married. Number of Children  2. Living Situation  Lives with spouse.  Past Surgical History [name]; 02/13/2017 3:01 PM) Arthroscopic Knee Surgery - Left 2002 Arthroscopic Knee Surgery - Right 2005 Cataract Extraction-Bilateral 2007 Nephrolithotomy, Removal of Calculi 09/2015  Medication History [name]; 02-13-2017 3:25 PM) Atorvastatin Calcium (10MG Tablet, 1/2 Oral daily) Active. Eliquis (5MG Tablet, 1 Oral two times daily) Active. Latanoprost (0.005% Solution, 1 drop each eye Ophthalmic at bedtime) Active. Lisinopril-Hydrochlorothiazide (20-12.5MG Tablet, 1 Oral daily)  Active. MetFORMIN HCl (1000MG Tablet, 1  Oral two times daily) Active. Vitamin D3 (400UNIT Capsule, 1 Oral at bedtime) Active. Spectravite Multivitamin (1 daily) Active. Loratadine (10MG Tablet, 1 Oral at bedtime) Active. Furosemide (20MG Tablet, 1 Oral daily) Active. Bystolic (01UU Tablet, 1 Oral daily) Active. Medications Reconciled (meds present)  Diagnostic Studies History [name]; 01/24/2017 3:20 PM) Treadmill stress test 1983 Sleep Study 2002 Normal. Renal Dopplers 07/2015 Colonoscopy 08/2015 Echocardiogram 10/05/2016 1. Left ventricle cavity is normal in size. Mild concentric hypertrophy of the left ventricle. Moderate decrease in global wall motion. Visual EF is 40-45%. 2. Left atrial cavity is moderately dilated. 3. Mild to moderate mitral regurgitation. 4. Mild tricuspid regurgitation. No evidence of pulmonary hypertension. Cardioversion 10/25/2016 A. Fib to NST with 100 J x 1. Chest X-ray 01/16/2017 fluid around heart    Review of Systems Brandon Page MD; 01/24/2017 5:26 PM) General Present- Fatigue. Not Present- Anorexia and Fever. Respiratory Present- Difficulty Breathing on Exertion, Snoring and Wakes up from Sleep Wheezing or Short of Breath. Not Present- Bloody sputum and Cough. Cardiovascular Present- Night Cramps. Not Present- Claudications, Edema, Orthopnea and Paroxysmal Nocturnal Dyspnea. Gastrointestinal Not Present- Black, Tarry Stool, Change in Bowel Habits and Nausea. Neurological Not Present- Focal Neurological Symptoms and Syncope. Endocrine Not Present- Cold Intolerance, Excessive Sweating, Heat Intolerance and Thyroid Problems. Hematology Not Present- Anemia, Easy Bruising, Petechiae and Prolonged Bleeding.  Vitals [name]; 01/24/2017 3:32 PM) 01/24/2017 3:18 PM Weight: 196.5 lb Height: 69in Body Surface Area: 2.05 m Body Mass Index: 29.02 kg/m  Pulse: 94 (Regular)  P.OX: 97% (Room air) BP: 140/76 (Sitting,  Left Arm, Standard)       Physical Exam Brandon Page MD; 01/24/2017 5:20 PM) General Mental Status-Alert. General Appearance-Cooperative and Appears stated age. Build & Nutrition-Well built.  Head and Neck Thyroid Gland Characteristics - normal size and consistency and no palpable nodules.  Chest and Lung Exam Chest and lung exam reveals -quiet, even and easy respiratory effort with no use of accessory muscles, non-tender and on auscultation, normal breath sounds, no adventitious sounds.  Cardiovascular Cardiovascular examination reveals -carotid auscultation reveals no bruits, abdominal aorta auscultation reveals no bruits and no prominent pulsation, femoral artery auscultation bilaterally reveals normal pulses, no bruits, no thrills, normal pedal pulses bilaterally and no digital clubbing, cyanosis, edema, increased warmth or tenderness. Auscultation Rhythm - Irregular. Heart Sounds - S1 is variable, S2 WNL and S3. Murmurs & Other Heart Sounds - Normal exam - No Murmurs.  Abdomen Inspection Hernias - Ventral hernia - Reducible. Palpation/Percussion Normal exam - Non Tender and No hepatosplenomegaly.  Neurologic Neurologic evaluation reveals -alert and oriented x 3 with no impairment of recent or remote memory. Motor-Grossly intact without any focal deficits.  Musculoskeletal Global Assessment Left Lower Extremity - no deformities, masses or tenderness, no known fractures. Right Lower Extremity - no deformities, masses or tenderness, no known fractures.    Assessment & Plan Brandon Page MD; 01/24/2017 5:28 PM) Acute on chronic combined systolic and diastolic CHF (congestive heart failure) (I50.43) Current Plans Discontinued Lisinopril-Hydrochlorothiazide 20-12.5MG (Switch to diovan). Started Valsartan 160MG, 1 (one) Tablet daily, #30, 01/24/2017, Ref. x1. Persistent atrial fibrillation (I48.1) Story: CHA2DS2-VASc Score is 3 with yearly risk of  stroke of 3.2%. HAS-Bled score is 1 and estimated major bleeding in one year is 1.02-1.5%.  Direct current cardioversion 10/25/2016: A. Fib to NSR with 100 J x 1. Impression: EKG 01/24/2017: Atrial fibrillation with controlled ventricular response at the rate of 89 bpm, normal axis, nonspecific T abnormality. Normal QT interval.  No significant change from EKG 11/02/2016, 10/18/2016. 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 Left ventricle cavity is normal in size. Mild concentric hypertrophy of the left ventricle. Moderate decrease in global wall motion. Visual EF is 40-45%. 2. Left atrial cavity is moderately dilated at 4.7 cm.. 3. Mild to moderate mitral regurgitation. 4. Mild tricuspid regurgitation. No evidence of pulmonary hypertension. Impression: Exercise sestamibi stress test 09/26/2016: 1. The resting electrocardiogram demonstrated atrial fibrillation, normal resting conduction and normal rest repolarization. The stress electrocardiogram was normal. Patient exercised on Bruce protocol for 5:02 minutes and achieved 4.64 METS. Stress test terminated due to 124% MPHR achieved (Target HR >85%), no symptoms. 2. The perfusion imaging study demonstrates mild diaphragmatic attenuation artifact, no evidence of ischemia or scar. LV systolic function calculated by QGS was mild to moderately depressed at 41% with global hypokinesis. Findings may suggest nonischemic cardiomyopathy. This is an intermediate risk study. Labwork Story: Labs 01/25/2017: Creatinine 1.19,  potassium 4.6, calcium 10.3, BMP otherwise normal.  CBC normal.  INR 0.9, PT 10.1. 01/16/2017: Serum glucose 137 mg, BUN 20, creatinine 1.3, eGFR 55, potassium 4.4. CMP otherwise normal. HB 16.2/HCT 48.6, platelets 261. Normal indicis. BNP 270.  10/18/2016: Glucose 146, creatinine 1.13, potassium 4.2  08/16/2016: glucose 220, creatinine 1.2, potassium 4.7, CMP otherwise normal, CBC normal, total cholesterol 140, triglycerides 170, HDL 34, LDL 72, ApoB 88, PSA normal, HbA1c 8.4% Benign essential hypertension (I10) Current Plans Mechanism of underlying disease process and action of medications discussed with the patient. Patient underwent direct current cardioversion on 10/25/2016, However on follow-up she was back in atrial fibrillation and had reported no significant change in symptoms in spite of being in sinus rhythm for a few days. He had opted for rate control strategy. His metoprolol was discontinued by PCP due to fatigue and was switched to Bystolic. No change in symptoms.  However over the past 2 months, she has developed worsening symptoms of dyspnea, acute decompensated heart failure with PND and orthopnea, I'm very concerned about his symptoms. He will need repeat echocardiogram to reevaluate his LV systolic function, he'll also need left heart catheterization to definitively exclude multivessel coronary artery disease in view of his cardiovascular risk factors and decreased LVEF. I'll see him back after these and make further recommendation.  I discontinued lisinopril HCT, advised him to continue Lasix that was started by his PCP with which she has lost about 10 pounds in weight, switched him to valsartan in view of consideration for starting Entresto. If no significant coronary artery disease is found, he will need EP evaluation and consideration for ablation in view of decreased LVEF. Schedule for cardiac catheterization, and possible angioplasty. We discussed regarding risks, benefits,  alternatives to this including stress testing, CTA and continued medical therapy. Patient wants to proceed. Understands <1-2% risk of death, stroke, MI, urgent CABG, bleeding, infection, renal failure but not limited to these.  CC Dr. Berneta Sages.  Signed by Brandon Page, MD (01/24/2017 5:29 PM)

## 2017-01-31 ENCOUNTER — Encounter (HOSPITAL_COMMUNITY)
Admission: RE | Disposition: A | Discharge status: Home / Self Care | Payer: Self-pay | Source: Ambulatory Visit | Attending: Cardiology

## 2017-01-31 ENCOUNTER — Encounter (HOSPITAL_COMMUNITY): Payer: Self-pay | Admitting: Cardiology

## 2017-01-31 ENCOUNTER — Ambulatory Visit (HOSPITAL_COMMUNITY)
Admission: RE | Admit: 2017-01-31 | Discharge: 2017-02-01 | Disposition: A | Discharge status: Home / Self Care | Payer: PPO | Source: Ambulatory Visit | Attending: Cardiology | Admitting: Cardiology

## 2017-01-31 DIAGNOSIS — I420 Dilated cardiomyopathy: Secondary | ICD-10-CM | POA: Diagnosis not present

## 2017-01-31 DIAGNOSIS — Z9861 Coronary angioplasty status: Secondary | ICD-10-CM

## 2017-01-31 DIAGNOSIS — I482 Chronic atrial fibrillation: Secondary | ICD-10-CM | POA: Insufficient documentation

## 2017-01-31 DIAGNOSIS — Z7984 Long term (current) use of oral hypoglycemic drugs: Secondary | ICD-10-CM | POA: Diagnosis not present

## 2017-01-31 DIAGNOSIS — I110 Hypertensive heart disease with heart failure: Secondary | ICD-10-CM | POA: Diagnosis not present

## 2017-01-31 DIAGNOSIS — I480 Paroxysmal atrial fibrillation: Secondary | ICD-10-CM | POA: Diagnosis present

## 2017-01-31 DIAGNOSIS — I5043 Acute on chronic combined systolic (congestive) and diastolic (congestive) heart failure: Secondary | ICD-10-CM | POA: Insufficient documentation

## 2017-01-31 DIAGNOSIS — Z87891 Personal history of nicotine dependence: Secondary | ICD-10-CM | POA: Diagnosis not present

## 2017-01-31 DIAGNOSIS — I2584 Coronary atherosclerosis due to calcified coronary lesion: Secondary | ICD-10-CM | POA: Insufficient documentation

## 2017-01-31 DIAGNOSIS — E785 Hyperlipidemia, unspecified: Secondary | ICD-10-CM | POA: Insufficient documentation

## 2017-01-31 DIAGNOSIS — I255 Ischemic cardiomyopathy: Secondary | ICD-10-CM | POA: Diagnosis not present

## 2017-01-31 DIAGNOSIS — I340 Nonrheumatic mitral (valve) insufficiency: Secondary | ICD-10-CM | POA: Insufficient documentation

## 2017-01-31 DIAGNOSIS — E118 Type 2 diabetes mellitus with unspecified complications: Secondary | ICD-10-CM | POA: Diagnosis not present

## 2017-01-31 DIAGNOSIS — Z955 Presence of coronary angioplasty implant and graft: Secondary | ICD-10-CM

## 2017-01-31 DIAGNOSIS — Z7901 Long term (current) use of anticoagulants: Secondary | ICD-10-CM | POA: Diagnosis not present

## 2017-01-31 DIAGNOSIS — I2510 Atherosclerotic heart disease of native coronary artery without angina pectoris: Secondary | ICD-10-CM | POA: Diagnosis not present

## 2017-01-31 DIAGNOSIS — I428 Other cardiomyopathies: Secondary | ICD-10-CM | POA: Diagnosis present

## 2017-01-31 DIAGNOSIS — R0602 Shortness of breath: Secondary | ICD-10-CM | POA: Diagnosis not present

## 2017-01-31 HISTORY — DX: Atherosclerotic heart disease of native coronary artery without angina pectoris: I25.10

## 2017-01-31 HISTORY — DX: Pneumonia, unspecified organism: J18.9

## 2017-01-31 HISTORY — PX: LEFT HEART CATH AND CORONARY ANGIOGRAPHY: CATH118249

## 2017-01-31 HISTORY — DX: Unspecified osteoarthritis, unspecified site: M19.90

## 2017-01-31 HISTORY — DX: Heart failure, unspecified: I50.9

## 2017-01-31 HISTORY — DX: Malignant melanoma of skin, unspecified: C43.9

## 2017-01-31 HISTORY — PX: CORONARY STENT INTERVENTION: CATH118234

## 2017-01-31 HISTORY — DX: Gastro-esophageal reflux disease without esophagitis: K21.9

## 2017-01-31 HISTORY — DX: Pure hypercholesterolemia, unspecified: E78.00

## 2017-01-31 HISTORY — DX: Type 2 diabetes mellitus without complications: E11.9

## 2017-01-31 LAB — GLUCOSE, CAPILLARY
GLUCOSE-CAPILLARY: 127 mg/dL — AB (ref 65–99)
GLUCOSE-CAPILLARY: 105 mg/dL — AB (ref 65–99)
Glucose-Capillary: 136 mg/dL — ABNORMAL HIGH (ref 65–99)
Glucose-Capillary: 158 mg/dL — ABNORMAL HIGH (ref 65–99)

## 2017-01-31 LAB — POCT ACTIVATED CLOTTING TIME
ACTIVATED CLOTTING TIME: 246 s
ACTIVATED CLOTTING TIME: 263 s

## 2017-01-31 SURGERY — LEFT HEART CATH AND CORONARY ANGIOGRAPHY
Anesthesia: LOCAL

## 2017-01-31 MED ORDER — HEPARIN (PORCINE) IN NACL 2-0.9 UNIT/ML-% IJ SOLN
INTRAMUSCULAR | Status: AC
  Filled 2017-01-31: qty 1000

## 2017-01-31 MED ORDER — FUROSEMIDE 10 MG/ML IJ SOLN: 40.0000 mg | Freq: Two times a day (BID) | INTRAMUSCULAR | Status: DC | PRN

## 2017-01-31 MED ORDER — LIDOCAINE HCL (PF) 1 % IJ SOLN
INTRAMUSCULAR | Status: DC | PRN
  Administered 2017-01-31: 2 mL via INTRADERMAL

## 2017-01-31 MED ORDER — HEPARIN SODIUM (PORCINE) 1000 UNIT/ML IJ SOLN
INTRAMUSCULAR | Status: DC | PRN
  Administered 2017-01-31: 5000 [IU] via INTRAVENOUS
  Administered 2017-01-31: 4000 [IU] via INTRAVENOUS
  Administered 2017-01-31 (×2): 3000 [IU] via INTRAVENOUS

## 2017-01-31 MED ORDER — DIPHENHYDRAMINE-APAP (SLEEP) 25-500 MG PO TABS: 2.0000 | ORAL_TABLET | Freq: Every evening | ORAL | Status: DC | PRN

## 2017-01-31 MED ORDER — HYDROMORPHONE HCL 1 MG/ML IJ SOLN
INTRAMUSCULAR | Status: AC
  Filled 2017-01-31: qty 1

## 2017-01-31 MED ORDER — MIDAZOLAM HCL 2 MG/2ML IJ SOLN
INTRAMUSCULAR | Status: AC
  Filled 2017-01-31: qty 2

## 2017-01-31 MED ORDER — SODIUM CHLORIDE 0.9 % IV SOLN: 250.0000 mL | INTRAVENOUS | Status: DC | PRN

## 2017-01-31 MED ORDER — IOPAMIDOL (ISOVUE-370) INJECTION 76%
INTRAVENOUS | Status: AC
  Filled 2017-01-31: qty 100

## 2017-01-31 MED ORDER — APIXABAN 5 MG PO TABS
5.0000 mg | ORAL_TABLET | Freq: Two times a day (BID) | ORAL | Status: DC
  Administered 2017-01-31: 21:00:00 5 mg via ORAL
  Filled 2017-01-31 (×2): qty 1

## 2017-01-31 MED ORDER — MIDAZOLAM HCL 2 MG/2ML IJ SOLN
INTRAMUSCULAR | Status: DC | PRN
  Administered 2017-01-31: 2 mg via INTRAVENOUS

## 2017-01-31 MED ORDER — HEPARIN (PORCINE) IN NACL 2-0.9 UNIT/ML-% IJ SOLN
INTRAMUSCULAR | Status: DC | PRN
  Administered 2017-01-31: 1000 mL via INTRA_ARTERIAL

## 2017-01-31 MED ORDER — CLOPIDOGREL BISULFATE 75 MG PO TABS
75.0000 mg | ORAL_TABLET | Freq: Every day | ORAL | Status: DC
  Administered 2017-02-01: 10:00:00 75 mg via ORAL
  Filled 2017-01-31: qty 1

## 2017-01-31 MED ORDER — HYDROMORPHONE HCL 1 MG/ML IJ SOLN
INTRAMUSCULAR | Status: DC | PRN
  Administered 2017-01-31: 0.5 mg via INTRAVENOUS

## 2017-01-31 MED ORDER — SODIUM CHLORIDE 0.9% FLUSH: 3.0000 mL | Freq: Two times a day (BID) | INTRAVENOUS | Status: DC

## 2017-01-31 MED ORDER — VERAPAMIL HCL 2.5 MG/ML IV SOLN
INTRA_ARTERIAL | Status: DC | PRN
  Administered 2017-01-31: 10 mL via INTRA_ARTERIAL

## 2017-01-31 MED ORDER — ASPIRIN 81 MG PO CHEW
CHEWABLE_TABLET | ORAL | Status: AC
  Filled 2017-01-31: qty 1

## 2017-01-31 MED ORDER — NEBIVOLOL HCL 10 MG PO TABS
10.0000 mg | ORAL_TABLET | Freq: Every day | ORAL | Status: DC
Start: 2017-02-01 — End: 2017-02-01
  Filled 2017-01-31: qty 1

## 2017-01-31 MED ORDER — IRBESARTAN 150 MG PO TABS
150.0000 mg | ORAL_TABLET | Freq: Every day | ORAL | Status: DC
  Filled 2017-01-31: qty 1
  Filled 2017-01-31: qty 2

## 2017-01-31 MED ORDER — LABETALOL HCL 5 MG/ML IV SOLN: 10.0000 mg | INTRAVENOUS | Status: AC | PRN

## 2017-01-31 MED ORDER — ATORVASTATIN CALCIUM 10 MG PO TABS
5.0000 mg | ORAL_TABLET | Freq: Every day | ORAL | Status: DC
  Filled 2017-01-31: qty 1

## 2017-01-31 MED ORDER — HEPARIN SODIUM (PORCINE) 1000 UNIT/ML IJ SOLN
INTRAMUSCULAR | Status: AC
  Filled 2017-01-31: qty 1

## 2017-01-31 MED ORDER — CLOPIDOGREL BISULFATE 300 MG PO TABS
ORAL_TABLET | ORAL | Status: AC
  Filled 2017-01-31: qty 2

## 2017-01-31 MED ORDER — HYDRALAZINE HCL 20 MG/ML IJ SOLN: 5.0000 mg | INTRAMUSCULAR | Status: AC | PRN

## 2017-01-31 MED ORDER — SODIUM CHLORIDE 0.9% FLUSH: 3.0000 mL | INTRAVENOUS | Status: DC | PRN

## 2017-01-31 MED ORDER — LATANOPROST 0.005 % OP SOLN
1.0000 [drp] | Freq: Every day | OPHTHALMIC | Status: DC
  Filled 2017-01-31 (×2): qty 2.5

## 2017-01-31 MED ORDER — NITROGLYCERIN 1 MG/10 ML FOR IR/CATH LAB
INTRA_ARTERIAL | Status: DC | PRN
  Administered 2017-01-31: 200 ug via INTRACORONARY
  Administered 2017-01-31: 200 ug via INTRA_ARTERIAL

## 2017-01-31 MED ORDER — IOPAMIDOL (ISOVUE-370) INJECTION 76%
INTRAVENOUS | Status: DC | PRN
Start: 2017-01-31 — End: 2017-01-31
  Administered 2017-01-31: 135 mL via INTRA_ARTERIAL

## 2017-01-31 MED ORDER — VERAPAMIL HCL 2.5 MG/ML IV SOLN
INTRAVENOUS | Status: AC
  Filled 2017-01-31: qty 2

## 2017-01-31 MED ORDER — ANGIOPLASTY BOOK
Freq: Once | Status: AC
  Administered 2017-01-31: 20:00:00
  Filled 2017-01-31: qty 1

## 2017-01-31 MED ORDER — INSULIN ASPART 100 UNIT/ML ~~LOC~~ SOLN
0.0000 [IU] | Freq: Three times a day (TID) | SUBCUTANEOUS | Status: DC
Start: 2017-01-31 — End: 2017-02-01
  Administered 2017-01-31: 2 [IU] via SUBCUTANEOUS
  Administered 2017-02-01: 07:00:00 3 [IU] via SUBCUTANEOUS

## 2017-01-31 MED ORDER — LORATADINE 10 MG PO TABS
10.0000 mg | ORAL_TABLET | Freq: Every day | ORAL | Status: DC
  Filled 2017-01-31: qty 1

## 2017-01-31 MED ORDER — LIDOCAINE HCL (PF) 1 % IJ SOLN
INTRAMUSCULAR | Status: AC
  Filled 2017-01-31: qty 30

## 2017-01-31 MED ORDER — CLOPIDOGREL BISULFATE 300 MG PO TABS
ORAL_TABLET | ORAL | Status: DC | PRN
  Administered 2017-01-31: 600 mg via ORAL

## 2017-01-31 MED ORDER — ACETAMINOPHEN 500 MG PO TABS: 1000.0000 mg | ORAL_TABLET | Freq: Every evening | ORAL | Status: DC | PRN

## 2017-01-31 MED ORDER — ONDANSETRON HCL 4 MG/2ML IJ SOLN: 4.0000 mg | Freq: Four times a day (QID) | INTRAMUSCULAR | Status: DC | PRN

## 2017-01-31 MED ORDER — CHOLECALCIFEROL 10 MCG (400 UNIT) PO TABS
400.0000 [IU] | ORAL_TABLET | Freq: Every day | ORAL | Status: DC
  Filled 2017-01-31: qty 1

## 2017-01-31 MED ORDER — SODIUM CHLORIDE 0.9 % IV SOLN
INTRAVENOUS | Status: DC
  Administered 2017-01-31: 09:00:00 via INTRAVENOUS

## 2017-01-31 MED ORDER — FUROSEMIDE 20 MG PO TABS
20.0000 mg | ORAL_TABLET | Freq: Every day | ORAL | Status: DC
  Filled 2017-01-31: qty 1

## 2017-01-31 MED ORDER — ASPIRIN 81 MG PO CHEW
81.0000 mg | CHEWABLE_TABLET | ORAL | Status: AC
  Administered 2017-01-31: 81 mg via ORAL

## 2017-01-31 MED ORDER — NITROGLYCERIN 1 MG/10 ML FOR IR/CATH LAB
INTRA_ARTERIAL | Status: AC
  Filled 2017-01-31: qty 10

## 2017-01-31 MED ORDER — SODIUM CHLORIDE 0.9 % IV SOLN
INTRAVENOUS | Status: AC
  Administered 2017-01-31: 12:00:00 via INTRAVENOUS

## 2017-01-31 MED ORDER — DIPHENHYDRAMINE HCL 25 MG PO CAPS: 25.0000 mg | ORAL_CAPSULE | Freq: Every evening | ORAL | Status: DC | PRN

## 2017-01-31 SURGICAL SUPPLY — 20 items
BALLN EUPHORA RX 3.0X15 (BALLOONS) ×2
BALLN ~~LOC~~ EUPHORA RX 3.5X12 (BALLOONS) ×2
BALLOON EUPHORA RX 3.0X15 (BALLOONS) ×1 IMPLANT
BALLOON ~~LOC~~ EUPHORA RX 3.5X12 (BALLOONS) ×1 IMPLANT
CATH HEARTRAIL 6F IL3.5 (CATHETERS) ×2 IMPLANT
CATH INFINITI 5 FR JL3.5 (CATHETERS) ×2 IMPLANT
CATH LAUNCHER 6FR AL1 (CATHETERS) ×1 IMPLANT
CATH OPTITORQUE TIG 4.0 5F (CATHETERS) ×2 IMPLANT
CATHETER LAUNCHER 6FR AL1 (CATHETERS) ×2
DEVICE RAD COMP TR BAND LRG (VASCULAR PRODUCTS) ×2 IMPLANT
GLIDESHEATH SLEND A-KIT 6F 20G (SHEATH) ×2 IMPLANT
GUIDEWIRE INQWIRE 1.5J.035X260 (WIRE) ×1 IMPLANT
INQWIRE 1.5J .035X260CM (WIRE) ×2
KIT ENCORE 26 ADVANTAGE (KITS) ×2 IMPLANT
KIT HEART LEFT (KITS) ×2 IMPLANT
PACK CARDIAC CATHETERIZATION (CUSTOM PROCEDURE TRAY) ×2 IMPLANT
STENT RESOLUTE ONYX 3.5X22 (Permanent Stent) ×2 IMPLANT
TRANSDUCER W/STOPCOCK (MISCELLANEOUS) ×2 IMPLANT
TUBING CIL FLEX 10 FLL-RA (TUBING) ×2 IMPLANT
WIRE COUGAR XT STRL 190CM (WIRE) ×2 IMPLANT

## 2017-01-31 NOTE — Interval H&P Note (Signed)
History and Physical Interval Note:  01/31/2017 9:36 AM  Brandon Morrow  has presented today for surgery, with the diagnosis of sob, afib, hf  The various methods of treatment have been discussed with the patient and family. After consideration of risks, benefits and other options for treatment, the patient has consented to  Procedure(s): Left Heart Cath and Coronary Angiography (N/A) and possible PCI as a surgical intervention .  The patient's history has been reviewed, patient examined, no change in status, stable for surgery.  I have reviewed the patient's chart and labs.  Questions were answered to the patient's satisfaction.   Ischemic Symptoms? CCS III (Marked limitation of ordinary activity) Anti-ischemic Medical Therapy? Minimal Therapy (1 class of medications) Non-invasive Test Results? No non-invasive testing performed Prior CABG? No Previous CABG   Patient Information:   1-2V CAD, no prox LAD  A (7)  Indication: 20; Score: 7   Patient Information:   1-2V-CAD with DS 50-60% With No FFR, No IVUS  I (3)  Indication: 21; Score: 3   Patient Information:   1-2V-CAD with DS 50-60% With FFR  A (7)  Indication: 22; Score: 7   Patient Information:   1-2V-CAD with DS 50-60% With FFR>0.8, IVUS not significant  I (2)  Indication: 23; Score: 2   Patient Information:   3V-CAD without LMCA With Abnormal LV systolic function  A (9)  Indication: 48; Score: 9   Patient Information:   LMCA-CAD  A (9)  Indication: 49; Score: 9   Patient Information:   2V-CAD with prox LAD PCI  A (7)  Indication: 62; Score: 7   Patient Information:   2V-CAD with prox LAD CABG  A (8)  Indication: 62; Score: 8   Patient Information:   3V-CAD without LMCA With Low CAD burden(i.e., 3 focal stenoses, low SYNTAX score) PCI  A (7)  Indication: 63; Score: 7   Patient Information:   3V-CAD without LMCA With Low CAD burden(i.e., 3 focal stenoses, low SYNTAX  score) CABG  A (9)  Indication: 63; Score: 9   Patient Information:   3V-CAD without LMCA E06c - Intermediate-high CAD burden (i.e., multiple diffuse lesions, presence of CTO, or high SYNTAX score) PCI  U (4)  Indication: 64; Score: 4   Patient Information:   3V-CAD without LMCA E06c - Intermediate-high CAD burden (i.e., multiple diffuse lesions, presence of CTO, or high SYNTAX score) CABG  A (9)  Indication: 64; Score: 9   Patient Information:   LMCA-CAD With Isolated LMCA stenosis  PCI  U (6)  Indication: 65; Score: 6   Patient Information:   LMCA-CAD With Isolated LMCA stenosis  CABG  A (9)  Indication: 65; Score: 9   Patient Information:   LMCA-CAD Additional CAD, low CAD burden (i.e., 1- to 2-vessel additional involvement, low SYNTAX score) PCI  U (5)  Indication: 66; Score: 5   Patient Information:   LMCA-CAD Additional CAD, low CAD burden (i.e., 1- to 2-vessel additional involvement, low SYNTAX score) CABG  A (9)  Indication: 66; Score: 9   Patient Information:   LMCA-CAD Additional CAD, intermediate-high CAD burden (i.e., 3-vessel involvement, presence of CTO, or high SYNTAX score) PCI  I (3)  Indication: 67; Score: 3   Patient Information:   LMCA-CAD Additional CAD, intermediate-high CAD burden (i.e., 3-vessel involvement, presence of CTO, or high SYNTAX score) CABG  A (9)  Indication: 67; Score: 9   Brandon Morrow

## 2017-01-31 NOTE — Progress Notes (Signed)
TR BAND REMOVAL  LOCATION:    right radial  DEFLATED PER PROTOCOL:    Yes.    TIME BAND OFF / DRESSING APPLIED:    1530  SITE UPON ARRIVAL:    Level 0  SITE AFTER BAND REMOVAL:    Level 0  CIRCULATION SENSATION AND MOVEMENT:    Within Normal Limits   Yes.    COMMENTS:   Rechecked at 1600 and frequently during shift with no change in site, dressing remains dry and intact

## 2017-01-31 NOTE — Progress Notes (Signed)
Notified Dr. Nadyne Coombes of patients pause of 3.17 seconds, patient asymptomatic. Dr. Nadyne Coombes stated no change in patient treatment. Dr. Nadyne Coombes confirmed that first dose of Eliquis was to be given tonight.

## 2017-01-31 NOTE — Care Management Note (Signed)
Case Management Note  Patient Details  Name: Brandon Morrow MRN: [id number] Date of Birth: [birth year]  Subjective/Objective:   s/p coronary stent intervention, will be on plavix, NCM will cont to follow for dc needs.                 Action/Plan:   Expected Discharge Date:                  Expected Discharge Plan:  Home/Self Care  In-House Referral:     Discharge planning Services  CM Consult  Post Acute Care Choice:    Choice offered to:     DME Arranged:    DME Agency:     HH Arranged:    HH Agency:     Status of Service:  In process, will continue to follow  If discussed at Long Length of Stay Meetings, dates discussed:    Additional Comments:  Zenon Mayo, RN 01/31/2017, 8:21 PM

## 2017-01-31 NOTE — Progress Notes (Signed)
Accepted patient to holding area following procedure. Will attempt to call report to the floor at 11:20. Patient calm, relaxed. Right wrist TR band level 0. C/o 4/10 chest soreness, procedural team stated he had this during the procedure and that MD is aware. Vital signs stable. Post PCI EKG was preformed by procedural team. CBG taken, patient given a sandwich and drink, no complaints.

## 2017-02-01 DIAGNOSIS — I110 Hypertensive heart disease with heart failure: Secondary | ICD-10-CM | POA: Diagnosis not present

## 2017-02-01 DIAGNOSIS — I2510 Atherosclerotic heart disease of native coronary artery without angina pectoris: Secondary | ICD-10-CM | POA: Diagnosis not present

## 2017-02-01 DIAGNOSIS — I420 Dilated cardiomyopathy: Secondary | ICD-10-CM | POA: Diagnosis not present

## 2017-02-01 DIAGNOSIS — R0602 Shortness of breath: Secondary | ICD-10-CM | POA: Diagnosis not present

## 2017-02-01 DIAGNOSIS — Z7984 Long term (current) use of oral hypoglycemic drugs: Secondary | ICD-10-CM | POA: Diagnosis not present

## 2017-02-01 DIAGNOSIS — E118 Type 2 diabetes mellitus with unspecified complications: Secondary | ICD-10-CM | POA: Diagnosis not present

## 2017-02-01 DIAGNOSIS — I5043 Acute on chronic combined systolic (congestive) and diastolic (congestive) heart failure: Secondary | ICD-10-CM | POA: Diagnosis not present

## 2017-02-01 DIAGNOSIS — Z7901 Long term (current) use of anticoagulants: Secondary | ICD-10-CM | POA: Diagnosis not present

## 2017-02-01 DIAGNOSIS — I482 Chronic atrial fibrillation: Secondary | ICD-10-CM | POA: Diagnosis not present

## 2017-02-01 DIAGNOSIS — Z87891 Personal history of nicotine dependence: Secondary | ICD-10-CM | POA: Diagnosis not present

## 2017-02-01 DIAGNOSIS — I255 Ischemic cardiomyopathy: Secondary | ICD-10-CM | POA: Diagnosis not present

## 2017-02-01 DIAGNOSIS — I2584 Coronary atherosclerosis due to calcified coronary lesion: Secondary | ICD-10-CM | POA: Diagnosis not present

## 2017-02-01 DIAGNOSIS — E785 Hyperlipidemia, unspecified: Secondary | ICD-10-CM | POA: Diagnosis not present

## 2017-02-01 LAB — GLUCOSE, CAPILLARY: Glucose-Capillary: 167 mg/dL — ABNORMAL HIGH (ref 65–99)

## 2017-02-01 LAB — CBC
HCT: 43.5 % (ref 39.0–52.0)
Hemoglobin: 14.7 g/dL (ref 13.0–17.0)
MCH: 30.4 pg (ref 26.0–34.0)
MCHC: 33.8 g/dL (ref 30.0–36.0)
MCV: 90.1 fL (ref 78.0–100.0)
PLATELETS: 218 10*3/uL (ref 150–400)
RBC: 4.83 MIL/uL (ref 4.22–5.81)
RDW: 13.2 % (ref 11.5–15.5)
WBC: 10.7 10*3/uL — ABNORMAL HIGH (ref 4.0–10.5)

## 2017-02-01 LAB — BASIC METABOLIC PANEL
ANION GAP: 9 (ref 5–15)
BUN: 11 mg/dL (ref 6–20)
CALCIUM: 8.8 mg/dL — AB (ref 8.9–10.3)
CHLORIDE: 105 mmol/L (ref 101–111)
CO2: 25 mmol/L (ref 22–32)
CREATININE: 1.05 mg/dL (ref 0.61–1.24)
GFR calc Af Amer: >60 mL/min (ref >60)
GFR calc non Af Amer: >60 mL/min (ref >60)
Glucose, Bld: 137 mg/dL — ABNORMAL HIGH (ref 65–99)
Potassium: 4 mmol/L (ref 3.5–5.1)
SODIUM: 139 mmol/L (ref 135–145)

## 2017-02-01 MED ORDER — CLOPIDOGREL BISULFATE 75 MG PO TABS: 75.0000 mg | ORAL_TABLET | Freq: Every day | ORAL | 2 refills | Status: DC

## 2017-02-01 MED ORDER — METFORMIN HCL 1000 MG PO TABS: 1000.0000 mg | ORAL_TABLET | Freq: Two times a day (BID) | ORAL | 5 refills | Status: AC

## 2017-02-01 NOTE — Discharge Summary (Signed)
Physician Discharge Summary  Patient ID: Brandon Morrow MRN: [id number] DOB/AGE: 05/22/1946 71 y.o.  Admit date: 01/31/2017 Discharge date: 02/01/2017  Primary Discharge Diagnosis Coronary artery disease of the native vessel. PTCA and stenting of the proximal and mid LAD with a 3.5 x 22 mm resolute onyx DES. Mild disease in other vessels. Acute on chronic systolic and diastolic heart failure. Ischemic and nonischemic cardiomyopathy. Chronic atrial fibrillation. CHA2DS2-VASc Score is 3 with yearly risk of stroke of 3.2%. HAS-Bled score is 1 and estimated major bleeding in one year is 1.02-1.5%.  Secondary Discharge Diagnosis Essential hypertension Diabetes mellitus type 2 controlled   Significant Diagnostic Studies: Coronary angiogram 01/31/2017: Proximal to mid LAD high-grade 90% stenosis, mildly calcified. Very minimal luminal irregularity in the circumflex and right coronary artery. Left ventricle mildly dilated, global hypokinesis, EF around 25%. Mildly elevated EDP of 18 mmHg. S/P stenting of proximal and mid LAD with 3.5 x 22 mm resolute onyx DES, stenosis reduced from 90% to 0% with maintenance of TIMI-3 to TIMI-3 flow.  Hospital Course:  Admitted for elective coronary angiography due to recent worsening dyspnea. Coronary angiography revealed high-grade stenosis in the proximal to midsegment of the LAD for which she underwent successful angioplasty. The following morning he was felt stable for discharge.   Recommendations on discharge: Patient is presently on anticoagulation with Eliquis, I have added clopidogrel for antiplatelet effect. He needs to be monitored closely for bradycardia dictation. He'll be discharged home with outpatient follow-up.  Discharge Exam: Blood pressure (!) 136/96, pulse 87, temperature 99.2 F (37.3 C), temperature source Oral, resp. rate (!) 22, height 5\' 9"  (1.753 m), weight 86 kg (189 lb 9.5 oz), SpO2 99 %.  General appearance: alert, appears stated age  and no distress Resp: clear to auscultation bilaterally Cardio: irregularly irregular rhythm, no S3 or S4, no rub and norub GI: soft, non-tender; bowel sounds normal; no masses,  no organomegaly Extremities: extremities normal, atraumatic, no cyanosis or edema Pulses: 2+ and symmetric Neurologic: Grossly normal Labs:   Lab Results  Component Value Date   WBC 10.7 (H) 02/01/2017   HGB 14.7 02/01/2017   HCT 43.5 02/01/2017   MCV 90.1 02/01/2017   PLT 218 02/01/2017    Recent Labs Lab 02/01/17 0326  NA 139  K 4.0  CL 105  CO2 25  BUN 11  CREATININE 1.05  CALCIUM 8.8*  GLUCOSE 137*    Lipid Panel  No results found for: CHOL, TRIG, HDL, CHOLHDL, VLDL, LDLCALC  BNP (last 3 results) No results for input(s): BNP in the last 8760 hours.  HEMOGLOBIN A1C Lab Results  Component Value Date   HGBA1C 8.1 (H) 10/18/2015   MPG 186 10/18/2015    Cardiac Panel (last 3 results) No results for input(s): CKTOTAL, CKMB, TROPONINI, RELINDX in the last 8760 hours.  No results found for: CKTOTAL, CKMB, CKMBINDEX, TROPONINI   TSH No results for input(s): TSH in the last 8760 hours.  EKG: EKG 02/01/2017: Atrial fibrillation with controlled ventricular response. Nonspecific T-wave flattening. Poorly progression, cannot exclude anterior infarct old. Normal QT interval. FOLLOW UP PLANS AND APPOINTMENTS Discharge Instructions    Amb Referral to Cardiac Rehabilitation    Complete by:  As directed    Diagnosis:  Coronary Stents   Diet - low sodium heart healthy    Complete by:  As directed    Diet - low sodium heart healthy    Complete by:  As directed    Increase activity slowly  Complete by:  As directed    Increase activity slowly    Complete by:  As directed      Allergies as of 02/01/2017      Reactions   Codeine Itching   Oxycodone Itching   Sulfa Antibiotics Rash      Medication List    TAKE these medications   acetaminophen 500 MG tablet Commonly known as:   TYLENOL Take 1,000 mg by mouth at bedtime as needed for moderate pain or headache.   atorvastatin 10 MG tablet Commonly known as:  LIPITOR Take 5 mg by mouth daily.   calcium carbonate 500 MG chewable tablet Commonly known as:  TUMS - dosed in mg elemental calcium Chew 2 tablets by mouth daily as needed for indigestion or heartburn.   clopidogrel 75 MG tablet Commonly known as:  PLAVIX Take 1 tablet (75 mg total) by mouth daily with breakfast.   diphenhydramine-acetaminophen 25-500 MG Tabs tablet Commonly known as:  TYLENOL PM Take 2 tablets by mouth at bedtime as needed (sleep).   ELIQUIS 5 MG Tabs tablet Generic drug:  apixaban Take 5 mg by mouth 2 (two) times daily.   furosemide 20 MG tablet Commonly known as:  LASIX Take 20 mg by mouth daily.   latanoprost 0.005 % ophthalmic solution Commonly known as:  XALATAN Place 1 drop into both eyes at bedtime.   loratadine 10 MG tablet Commonly known as:  CLARITIN Take 10 mg by mouth at bedtime.   metFORMIN 1000 MG tablet Commonly known as:  GLUCOPHAGE Take 1 tablet (1,000 mg total) by mouth 2 (two) times daily. Start taking on:  02/02/2017   MULTIVITAMIN ADULT PO Take 1 tablet by mouth daily.   nebivolol 10 MG tablet Commonly known as:  BYSTOLIC Take 10 mg by mouth daily.   valsartan 160 MG tablet Commonly known as:  DIOVAN Take 160 mg by mouth daily.   Vitamin D3 400 units Caps Take 400 Units by mouth at bedtime.      Follow-up Information    Adrian Prows, MD Follow up.   Specialty:  Cardiology Why:  Keep previous appointment Contact information: [address] [place] [address] [phone]            Adrian Prows, MD 02/01/2017, 9:28 AM  Pager: [phone] Office: [phone] If no answer: [phone]

## 2017-02-01 NOTE — Progress Notes (Signed)
CARDIAC REHAB PHASE I   PRE:  Rate/Rhythm: 91 a fib  BP:  Sitting: 136/96      MODE:  Ambulation: 500 ft   POST:  Rate/Rhythm: 101 a fib  BP:  Sitting: 150/87         SaO2: 96 RA  Pt ambulated 500 ft on RA, handheld assist, steady gait, tolerated well with no complaints. Completed PCI/stent/CHF education with pt and wife at bedside.  Reviewed risk factors, PCI book, anti-platelet therapy, stent card, activity restrictions, ntg, exercise, heart healthy diet, carb counting, sodium restrictions, CHF booklet and zone tool, daily weights and phase 2 cardiac rehab. Pt and verbalized understanding. Pt agrees to phase 2 cardiac rehab referral, will send to [facility] per pt request. Pt to bed per pt request after walk, call bell within reach.  [place], RN, BSN 02/01/2017 9:06 AM

## 2017-02-02 ENCOUNTER — Other Ambulatory Visit (HOSPITAL_COMMUNITY): Payer: PPO

## 2017-02-07 ENCOUNTER — Telehealth (HOSPITAL_COMMUNITY): Payer: Self-pay | Admitting: Internal Medicine

## 2017-02-07 DIAGNOSIS — I5043 Acute on chronic combined systolic (congestive) and diastolic (congestive) heart failure: Secondary | ICD-10-CM | POA: Diagnosis not present

## 2017-02-07 DIAGNOSIS — R0602 Shortness of breath: Secondary | ICD-10-CM | POA: Diagnosis not present

## 2017-02-07 DIAGNOSIS — I481 Persistent atrial fibrillation: Secondary | ICD-10-CM | POA: Diagnosis not present

## 2017-02-07 NOTE — Telephone Encounter (Signed)
S/w LaShandra verifying insurance benefits Copay $15, No coinsurance or Deductible Out of Pocket $3400.00, pt has met $255.34, pt's responsibility [phone] Reference #[id number]... KJ

## 2017-02-09 DIAGNOSIS — I2510 Atherosclerotic heart disease of native coronary artery without angina pectoris: Secondary | ICD-10-CM | POA: Diagnosis not present

## 2017-02-09 DIAGNOSIS — R0609 Other forms of dyspnea: Secondary | ICD-10-CM | POA: Diagnosis not present

## 2017-02-09 DIAGNOSIS — I5043 Acute on chronic combined systolic (congestive) and diastolic (congestive) heart failure: Secondary | ICD-10-CM | POA: Diagnosis not present

## 2017-02-09 DIAGNOSIS — I481 Persistent atrial fibrillation: Secondary | ICD-10-CM | POA: Diagnosis not present

## 2017-02-20 DIAGNOSIS — R0609 Other forms of dyspnea: Secondary | ICD-10-CM | POA: Diagnosis not present

## 2017-02-22 DIAGNOSIS — I2510 Atherosclerotic heart disease of native coronary artery without angina pectoris: Secondary | ICD-10-CM | POA: Diagnosis not present

## 2017-02-22 DIAGNOSIS — R0609 Other forms of dyspnea: Secondary | ICD-10-CM | POA: Diagnosis not present

## 2017-02-22 DIAGNOSIS — I481 Persistent atrial fibrillation: Secondary | ICD-10-CM | POA: Diagnosis not present

## 2017-02-22 DIAGNOSIS — I5042 Chronic combined systolic (congestive) and diastolic (congestive) heart failure: Secondary | ICD-10-CM | POA: Diagnosis not present

## 2017-03-03 ENCOUNTER — Telehealth (HOSPITAL_COMMUNITY): Payer: Self-pay

## 2017-03-14 DIAGNOSIS — I5042 Chronic combined systolic (congestive) and diastolic (congestive) heart failure: Secondary | ICD-10-CM | POA: Diagnosis not present

## 2017-03-14 DIAGNOSIS — R0609 Other forms of dyspnea: Secondary | ICD-10-CM | POA: Diagnosis not present

## 2017-03-14 DIAGNOSIS — I2510 Atherosclerotic heart disease of native coronary artery without angina pectoris: Secondary | ICD-10-CM | POA: Diagnosis not present

## 2017-03-14 DIAGNOSIS — I420 Dilated cardiomyopathy: Secondary | ICD-10-CM | POA: Diagnosis not present

## 2017-03-15 ENCOUNTER — Telehealth (HOSPITAL_COMMUNITY): Payer: Self-pay

## 2017-03-22 ENCOUNTER — Telehealth (HOSPITAL_COMMUNITY): Payer: Self-pay

## 2017-03-22 NOTE — Telephone Encounter (Signed)
I called and left message on patient voicemail to call office about scheduling for cardiac rehab. I left office contact information on patient voicemail to return call.  ° °

## 2017-03-24 NOTE — Addendum Note (Signed)
Addendum  created 03/24/17 1225 by [name], MD   Sign clinical note

## 2017-03-24 NOTE — Anesthesia Postprocedure Evaluation (Signed)
Anesthesia Post Note  Patient: Brandon Morrow  Procedure(s) Performed: Procedure(s) (LRB): CARDIOVERSION (N/A)     Anesthesia Post Evaluation  Last Vitals:  Vitals:   10/25/16 1315 10/25/16 1325  BP: (!) 144/81 (!) 147/86  Pulse: 70 72  Resp: 20 (!) 21  Temp:      Last Pain:  Vitals:   10/25/16 1255  TempSrc: Oral                 [name]

## 2017-04-13 ENCOUNTER — Encounter (HOSPITAL_COMMUNITY): Payer: Self-pay

## 2017-04-13 NOTE — Progress Notes (Signed)
*  Final Notice* I have mailed patient a letter with information about cardiac rehab, patient has been called 2X. If patient does not respond to the letter, referral will be canceled.

## 2017-04-25 DIAGNOSIS — R05 Cough: Secondary | ICD-10-CM | POA: Diagnosis not present

## 2017-04-25 DIAGNOSIS — Z6829 Body mass index (BMI) 29.0-29.9, adult: Secondary | ICD-10-CM | POA: Diagnosis not present

## 2017-04-25 DIAGNOSIS — M2550 Pain in unspecified joint: Secondary | ICD-10-CM | POA: Diagnosis not present

## 2017-04-25 DIAGNOSIS — J069 Acute upper respiratory infection, unspecified: Secondary | ICD-10-CM | POA: Diagnosis not present

## 2017-05-11 DIAGNOSIS — I10 Essential (primary) hypertension: Secondary | ICD-10-CM | POA: Diagnosis not present

## 2017-05-11 DIAGNOSIS — E1151 Type 2 diabetes mellitus with diabetic peripheral angiopathy without gangrene: Secondary | ICD-10-CM | POA: Diagnosis not present

## 2017-05-11 DIAGNOSIS — J069 Acute upper respiratory infection, unspecified: Secondary | ICD-10-CM | POA: Diagnosis not present

## 2017-05-11 DIAGNOSIS — I480 Paroxysmal atrial fibrillation: Secondary | ICD-10-CM | POA: Diagnosis not present

## 2017-05-11 DIAGNOSIS — Z6828 Body mass index (BMI) 28.0-28.9, adult: Secondary | ICD-10-CM | POA: Diagnosis not present

## 2017-05-11 DIAGNOSIS — J1 Influenza due to other identified influenza virus: Secondary | ICD-10-CM | POA: Diagnosis not present

## 2017-05-11 DIAGNOSIS — I5043 Acute on chronic combined systolic (congestive) and diastolic (congestive) heart failure: Secondary | ICD-10-CM | POA: Diagnosis not present

## 2017-05-11 DIAGNOSIS — I509 Heart failure, unspecified: Secondary | ICD-10-CM | POA: Diagnosis not present

## 2017-05-11 DIAGNOSIS — I2510 Atherosclerotic heart disease of native coronary artery without angina pectoris: Secondary | ICD-10-CM | POA: Diagnosis not present

## 2017-06-14 DIAGNOSIS — E119 Type 2 diabetes mellitus without complications: Secondary | ICD-10-CM | POA: Diagnosis not present

## 2017-06-14 DIAGNOSIS — H5213 Myopia, bilateral: Secondary | ICD-10-CM | POA: Diagnosis not present

## 2017-06-14 DIAGNOSIS — H26493 Other secondary cataract, bilateral: Secondary | ICD-10-CM | POA: Diagnosis not present

## 2017-06-14 DIAGNOSIS — H401131 Primary open-angle glaucoma, bilateral, mild stage: Secondary | ICD-10-CM | POA: Diagnosis not present

## 2017-06-20 DIAGNOSIS — I5042 Chronic combined systolic (congestive) and diastolic (congestive) heart failure: Secondary | ICD-10-CM | POA: Diagnosis not present

## 2017-06-20 DIAGNOSIS — I2510 Atherosclerotic heart disease of native coronary artery without angina pectoris: Secondary | ICD-10-CM | POA: Diagnosis not present

## 2017-06-20 DIAGNOSIS — R0609 Other forms of dyspnea: Secondary | ICD-10-CM | POA: Diagnosis not present

## 2017-06-20 DIAGNOSIS — I420 Dilated cardiomyopathy: Secondary | ICD-10-CM | POA: Diagnosis not present

## 2017-07-22 DIAGNOSIS — Z23 Encounter for immunization: Secondary | ICD-10-CM | POA: Diagnosis not present

## 2017-09-11 DIAGNOSIS — Z125 Encounter for screening for malignant neoplasm of prostate: Secondary | ICD-10-CM | POA: Diagnosis not present

## 2017-09-11 DIAGNOSIS — E7849 Other hyperlipidemia: Secondary | ICD-10-CM | POA: Diagnosis not present

## 2017-09-11 DIAGNOSIS — I10 Essential (primary) hypertension: Secondary | ICD-10-CM | POA: Diagnosis not present

## 2017-09-11 DIAGNOSIS — E1151 Type 2 diabetes mellitus with diabetic peripheral angiopathy without gangrene: Secondary | ICD-10-CM | POA: Diagnosis not present

## 2017-09-11 DIAGNOSIS — R82998 Other abnormal findings in urine: Secondary | ICD-10-CM | POA: Diagnosis not present

## 2017-09-20 DIAGNOSIS — I480 Paroxysmal atrial fibrillation: Secondary | ICD-10-CM | POA: Diagnosis not present

## 2017-09-20 DIAGNOSIS — N200 Calculus of kidney: Secondary | ICD-10-CM | POA: Diagnosis not present

## 2017-09-20 DIAGNOSIS — I2510 Atherosclerotic heart disease of native coronary artery without angina pectoris: Secondary | ICD-10-CM | POA: Diagnosis not present

## 2017-09-20 DIAGNOSIS — M1990 Unspecified osteoarthritis, unspecified site: Secondary | ICD-10-CM | POA: Diagnosis not present

## 2017-09-20 DIAGNOSIS — Z0000 Encounter for general adult medical examination without abnormal findings: Secondary | ICD-10-CM | POA: Diagnosis not present

## 2017-09-20 DIAGNOSIS — I509 Heart failure, unspecified: Secondary | ICD-10-CM | POA: Diagnosis not present

## 2017-09-20 DIAGNOSIS — E7849 Other hyperlipidemia: Secondary | ICD-10-CM | POA: Diagnosis not present

## 2017-09-20 DIAGNOSIS — I10 Essential (primary) hypertension: Secondary | ICD-10-CM | POA: Diagnosis not present

## 2017-09-20 DIAGNOSIS — E1151 Type 2 diabetes mellitus with diabetic peripheral angiopathy without gangrene: Secondary | ICD-10-CM | POA: Diagnosis not present

## 2017-09-20 DIAGNOSIS — Z6829 Body mass index (BMI) 29.0-29.9, adult: Secondary | ICD-10-CM | POA: Diagnosis not present

## 2017-09-20 DIAGNOSIS — Z7901 Long term (current) use of anticoagulants: Secondary | ICD-10-CM | POA: Diagnosis not present

## 2017-09-20 DIAGNOSIS — Z1389 Encounter for screening for other disorder: Secondary | ICD-10-CM | POA: Diagnosis not present

## 2017-09-28 DIAGNOSIS — Z85820 Personal history of malignant melanoma of skin: Secondary | ICD-10-CM | POA: Diagnosis not present

## 2017-09-28 DIAGNOSIS — D229 Melanocytic nevi, unspecified: Secondary | ICD-10-CM | POA: Diagnosis not present

## 2017-09-28 DIAGNOSIS — L821 Other seborrheic keratosis: Secondary | ICD-10-CM | POA: Diagnosis not present

## 2017-09-28 DIAGNOSIS — L814 Other melanin hyperpigmentation: Secondary | ICD-10-CM | POA: Diagnosis not present

## 2017-10-03 DIAGNOSIS — I5042 Chronic combined systolic (congestive) and diastolic (congestive) heart failure: Secondary | ICD-10-CM | POA: Diagnosis not present

## 2017-10-03 DIAGNOSIS — I10 Essential (primary) hypertension: Secondary | ICD-10-CM | POA: Diagnosis not present

## 2017-10-03 DIAGNOSIS — I481 Persistent atrial fibrillation: Secondary | ICD-10-CM | POA: Diagnosis not present

## 2017-10-03 DIAGNOSIS — I2510 Atherosclerotic heart disease of native coronary artery without angina pectoris: Secondary | ICD-10-CM | POA: Diagnosis not present

## 2017-10-03 DIAGNOSIS — I420 Dilated cardiomyopathy: Secondary | ICD-10-CM | POA: Diagnosis not present

## 2017-10-04 DIAGNOSIS — Z1212 Encounter for screening for malignant neoplasm of rectum: Secondary | ICD-10-CM | POA: Diagnosis not present

## 2017-10-26 DIAGNOSIS — I2510 Atherosclerotic heart disease of native coronary artery without angina pectoris: Secondary | ICD-10-CM | POA: Diagnosis not present

## 2017-10-26 DIAGNOSIS — I481 Persistent atrial fibrillation: Secondary | ICD-10-CM | POA: Diagnosis not present

## 2017-11-03 DIAGNOSIS — I2510 Atherosclerotic heart disease of native coronary artery without angina pectoris: Secondary | ICD-10-CM | POA: Diagnosis not present

## 2017-11-03 DIAGNOSIS — I481 Persistent atrial fibrillation: Secondary | ICD-10-CM | POA: Diagnosis not present

## 2017-11-03 DIAGNOSIS — I5042 Chronic combined systolic (congestive) and diastolic (congestive) heart failure: Secondary | ICD-10-CM | POA: Diagnosis not present

## 2017-11-10 DIAGNOSIS — I10 Essential (primary) hypertension: Secondary | ICD-10-CM | POA: Diagnosis not present

## 2017-11-18 DIAGNOSIS — R0989 Other specified symptoms and signs involving the circulatory and respiratory systems: Secondary | ICD-10-CM | POA: Diagnosis not present

## 2017-11-18 DIAGNOSIS — J9811 Atelectasis: Secondary | ICD-10-CM | POA: Diagnosis not present

## 2017-11-18 DIAGNOSIS — R05 Cough: Secondary | ICD-10-CM | POA: Diagnosis not present

## 2017-11-18 DIAGNOSIS — J189 Pneumonia, unspecified organism: Secondary | ICD-10-CM | POA: Diagnosis not present

## 2017-11-26 IMAGING — DX DG ABDOMEN 1V
2 series · 2 of 2 positions shown · non-contrast
Comparison: Plain film of 10/06/2015. Most recent CT of 06/03/2015.

CLINICAL DATA: Pre-op left ureteral stone

EXAM:
ABDOMEN - 1 VIEW

[abdomen kub (1 of 2)]
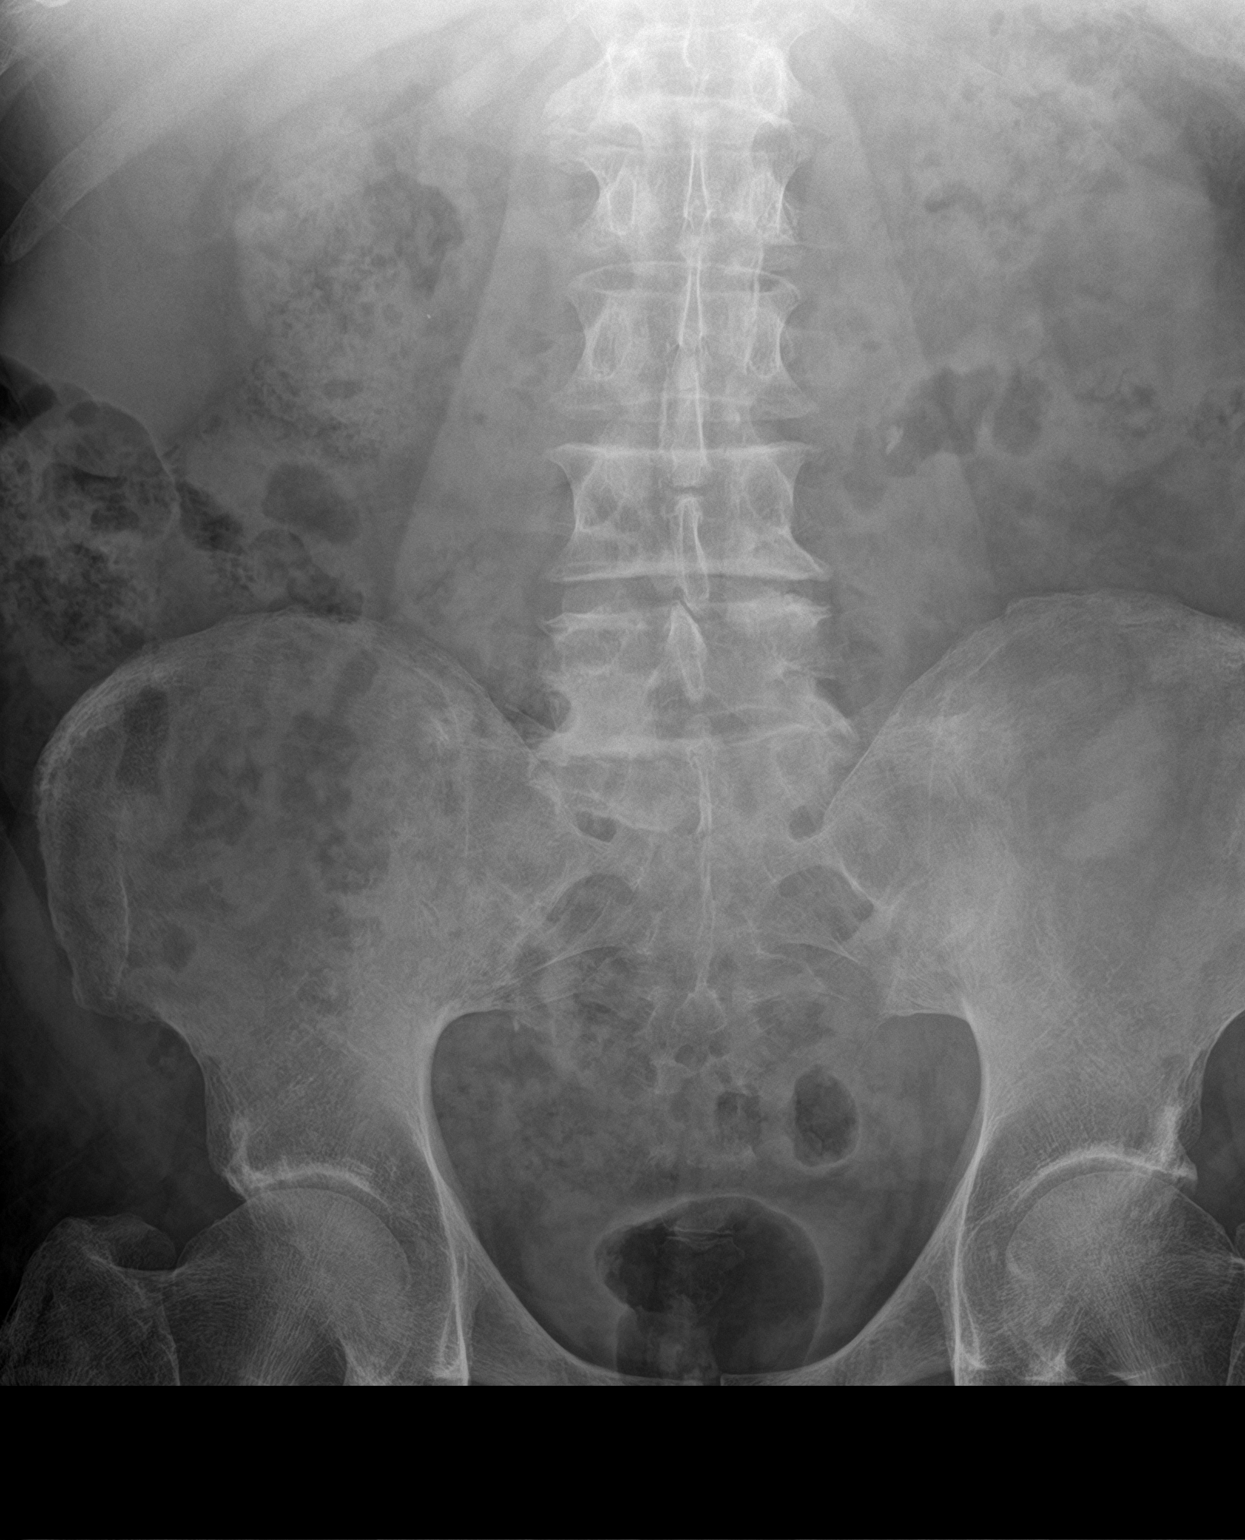

[abdomen kub (2 of 2)]
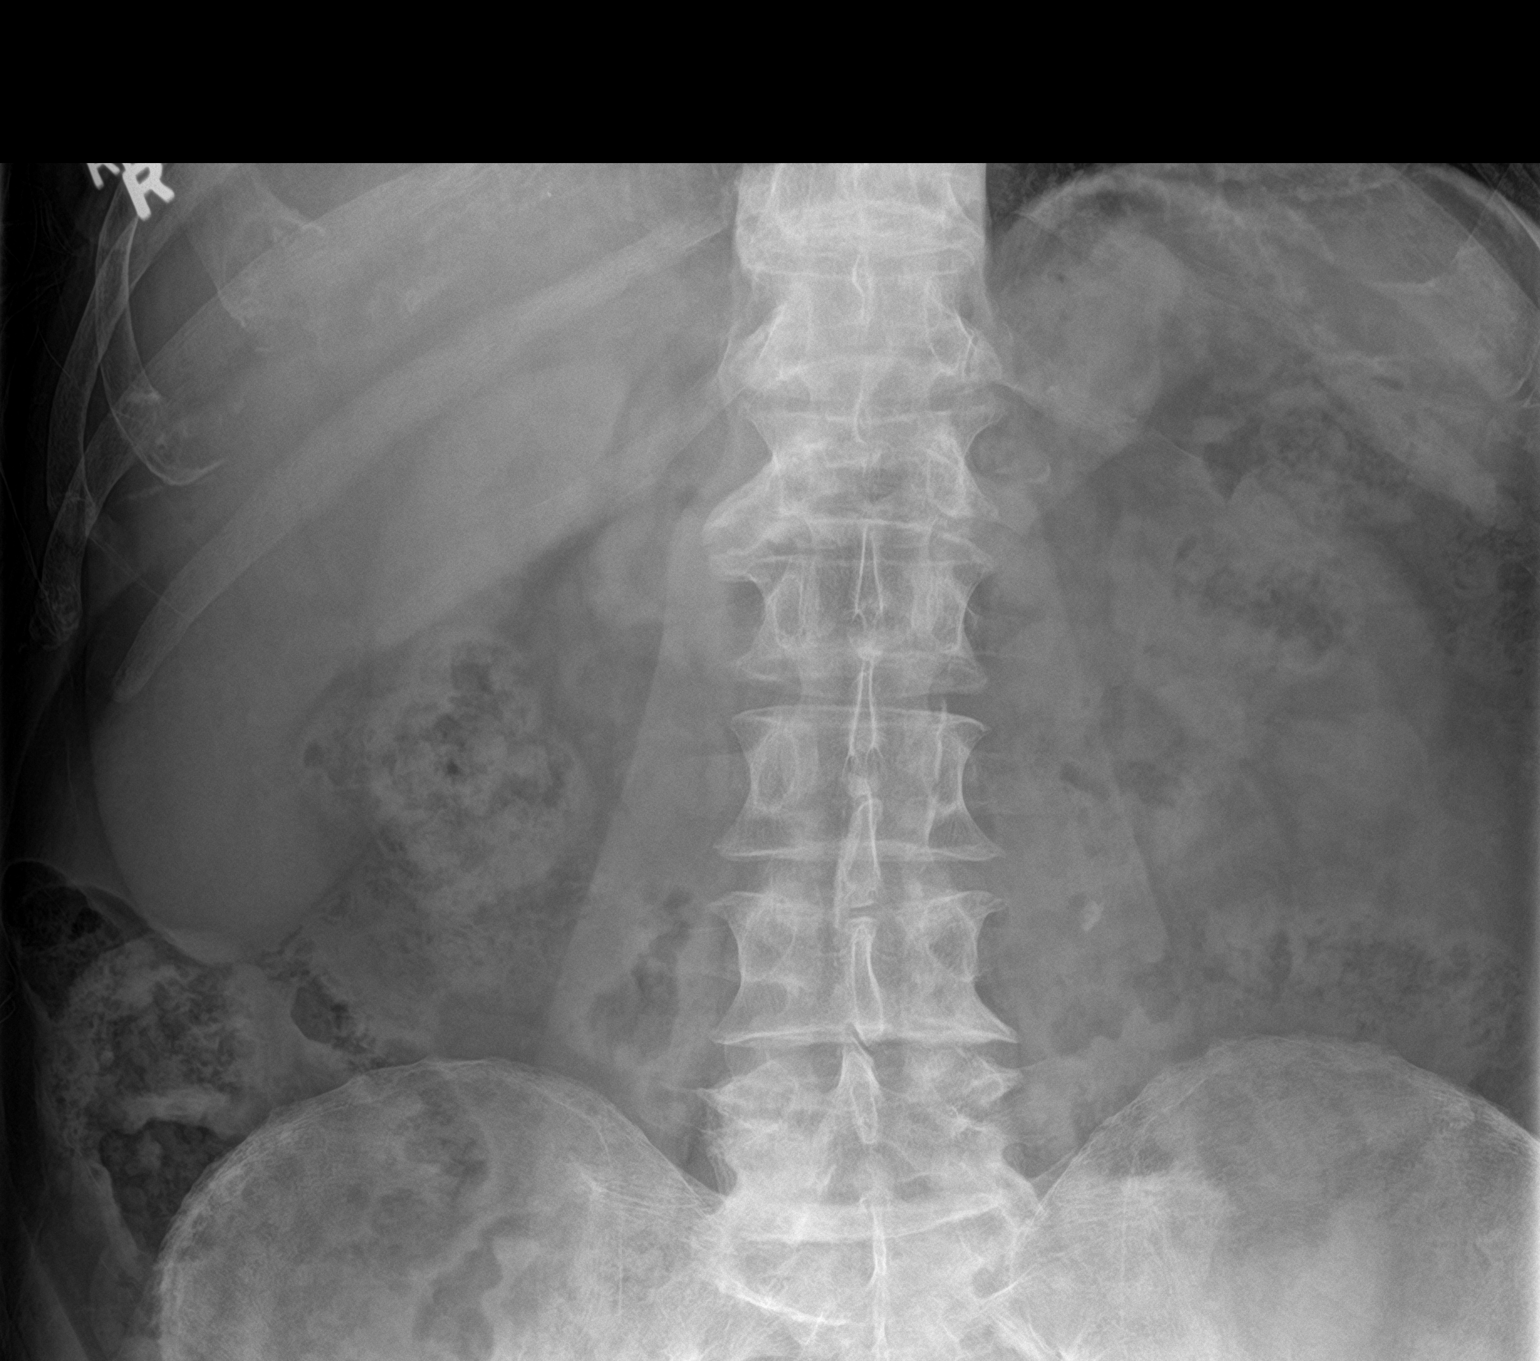

[2 of 2 positions shown; findings below may reference images not displayed]

FINDINGS: Non-obstructive bowel gas pattern. 7 mm calcification projecting
over the proximal to mid left ureter, likely corresponding to the
ureteric stone described on the prior exam. No new calcifications
over the kidneys or expected course of the ureters.
IMPRESSION: 7 mm left ureteric calculus.

## 2017-12-01 IMAGING — CR DG ABDOMEN 1V
1 series · 1 of 1 positions shown · non-contrast
Comparison: Radiographs 10/12/2015.

CLINICAL DATA: Left flank pain.  Recent stent placement.

EXAM:
ABDOMEN - 1 VIEW

[t abdomen supine]
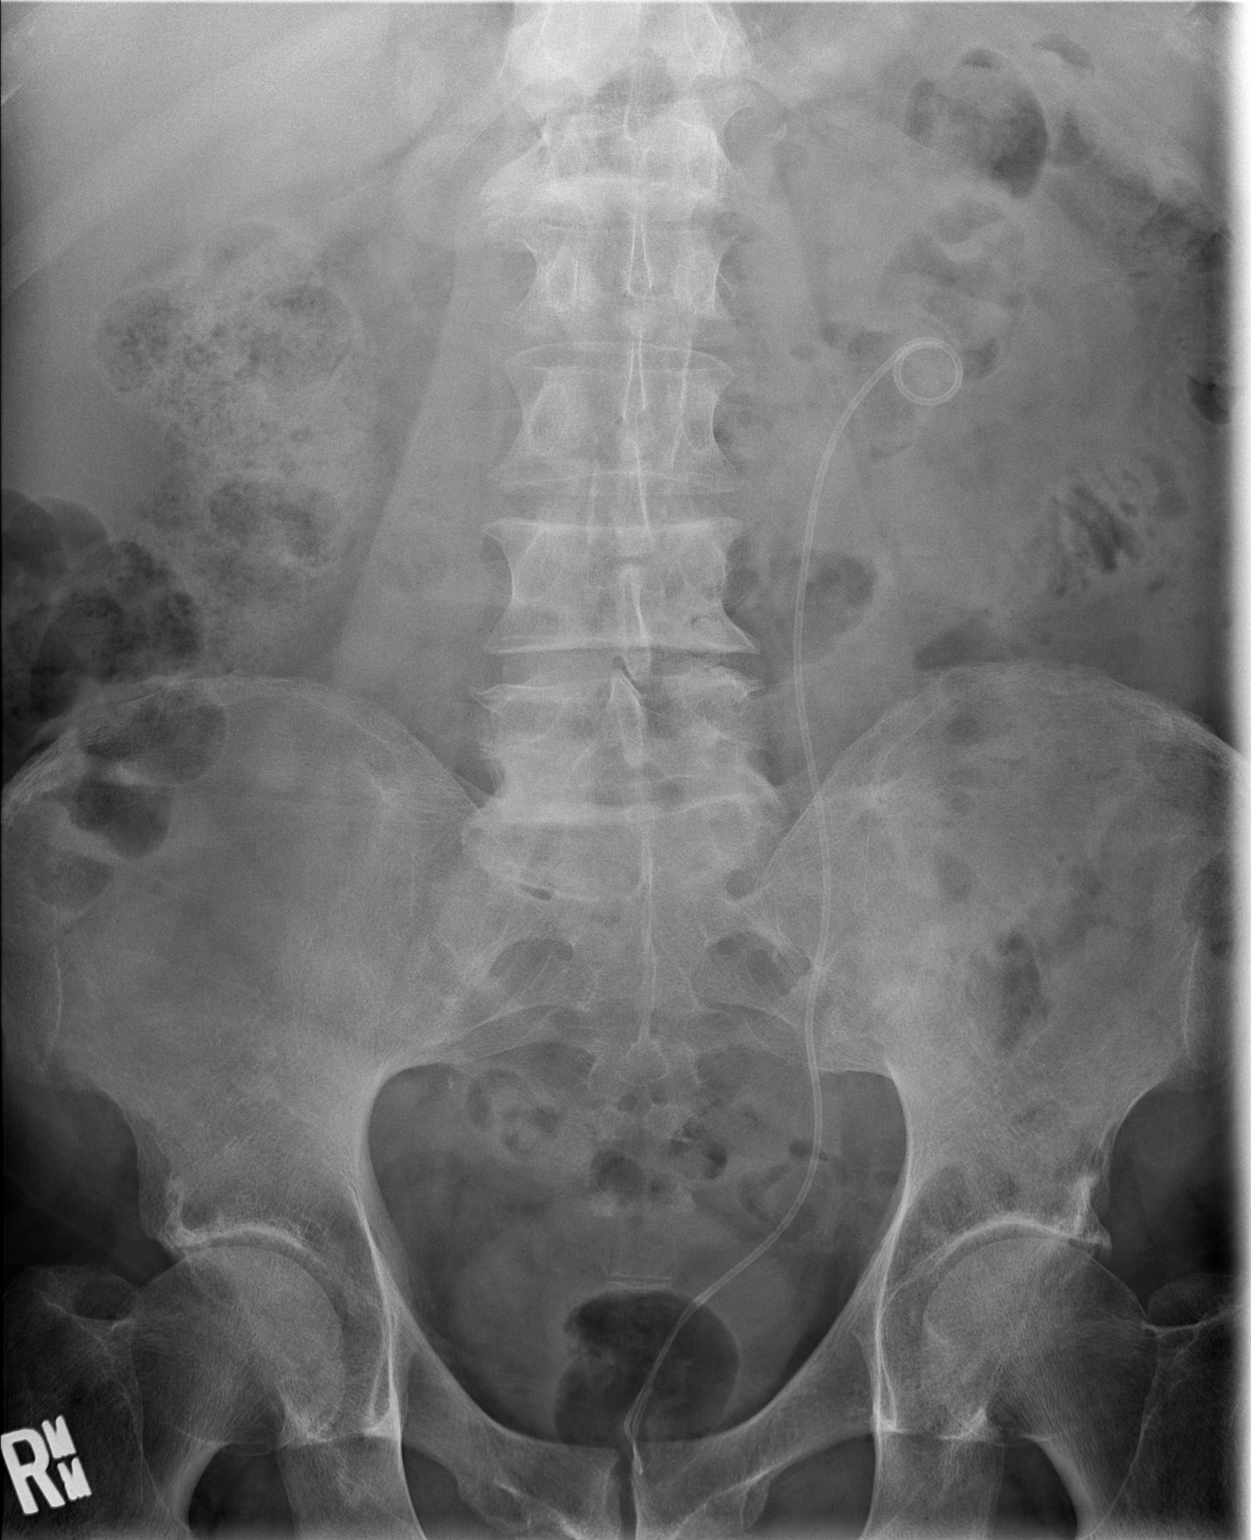

[1 of 1 positions shown; findings below may reference images not displayed]

FINDINGS: Double-J left ureteral stent appears well positioned. No definite
stone fragments are seen along the course of the stent. The
visualized bowel gas pattern is normal. Mild lumbar spine
degenerative changes are noted.
IMPRESSION: Satisfactory position of left ureteral stent. No stone fragments
seen along the course of the stent.

## 2017-12-02 IMAGING — CR DG CHEST 2V
2 series · 2 of 2 positions shown · non-contrast
Comparison: Fluoroscopic images [id number]; prior chest x-ray
06/03/2015

CLINICAL DATA: 69-year-old male with fever, chills and vomiting.
Recent placement of the day double-J ureteral stent

EXAM:
CHEST  2 VIEW

[w chest pa]
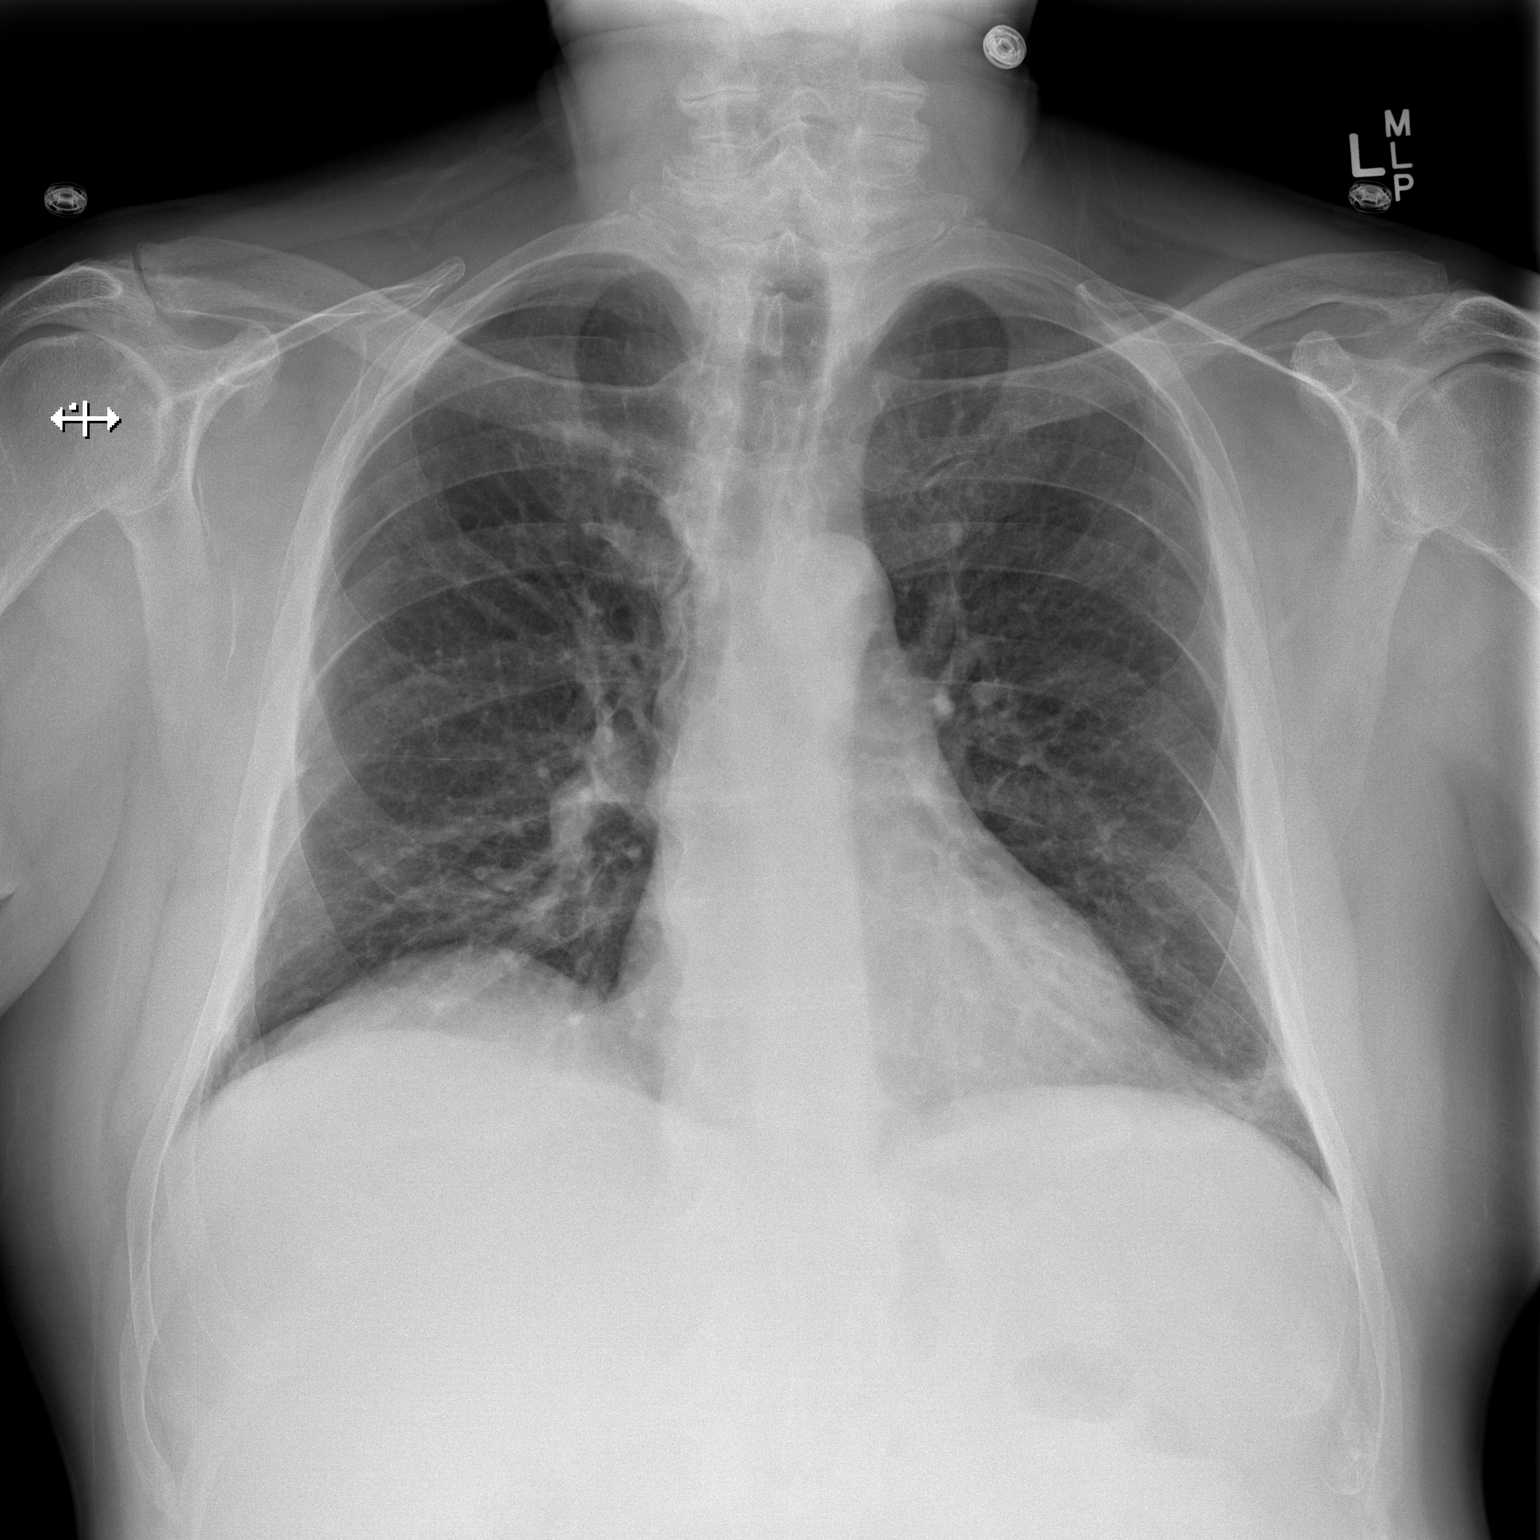

[w chest lat]
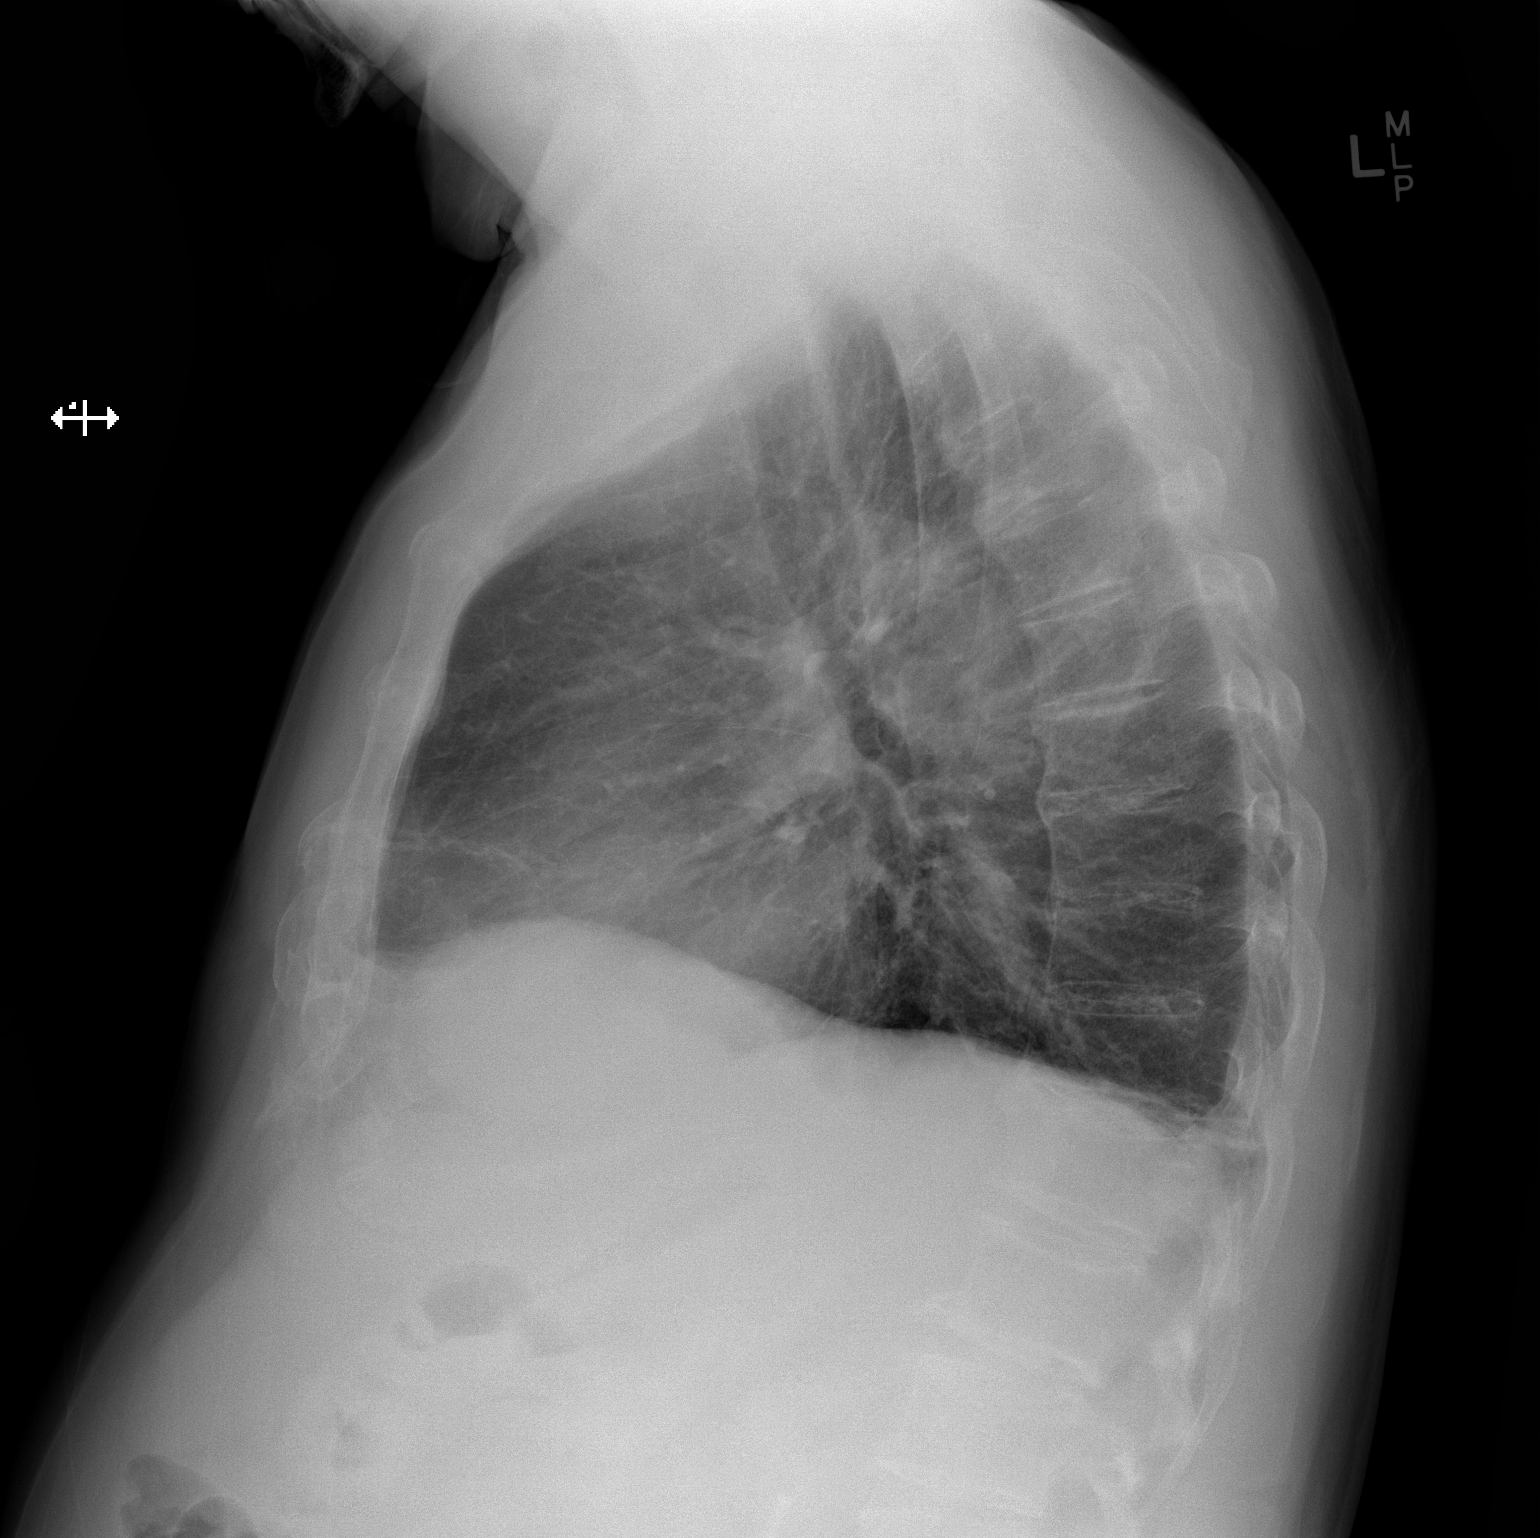

[2 of 2 positions shown; findings below may reference images not displayed]

FINDINGS: Focal patchy airspace opacity in the lingula appears linear in
nature and is favored to reflect atelectasis. Otherwise, the lungs
are clear. No pleural effusion or pneumothorax. Cardiac and
mediastinal contours are within normal limits. No acute fracture or
lytic or blastic osseous lesions. The visualized upper abdominal
bowel gas pattern is unremarkable.
IMPRESSION: 1. Lingular subsegmental atelectasis.
2. Otherwise, no acute cardiopulmonary process.

## 2017-12-13 DIAGNOSIS — H401131 Primary open-angle glaucoma, bilateral, mild stage: Secondary | ICD-10-CM | POA: Diagnosis not present

## 2017-12-19 DIAGNOSIS — I10 Essential (primary) hypertension: Secondary | ICD-10-CM | POA: Diagnosis not present

## 2018-02-01 DIAGNOSIS — I10 Essential (primary) hypertension: Secondary | ICD-10-CM | POA: Diagnosis not present

## 2018-02-01 DIAGNOSIS — I2510 Atherosclerotic heart disease of native coronary artery without angina pectoris: Secondary | ICD-10-CM | POA: Diagnosis not present

## 2018-02-01 DIAGNOSIS — M545 Low back pain: Secondary | ICD-10-CM | POA: Diagnosis not present

## 2018-02-01 DIAGNOSIS — E1151 Type 2 diabetes mellitus with diabetic peripheral angiopathy without gangrene: Secondary | ICD-10-CM | POA: Diagnosis not present

## 2018-02-01 DIAGNOSIS — Z6829 Body mass index (BMI) 29.0-29.9, adult: Secondary | ICD-10-CM | POA: Diagnosis not present

## 2018-02-01 DIAGNOSIS — I480 Paroxysmal atrial fibrillation: Secondary | ICD-10-CM | POA: Diagnosis not present

## 2018-02-01 DIAGNOSIS — E7849 Other hyperlipidemia: Secondary | ICD-10-CM | POA: Diagnosis not present

## 2018-02-01 DIAGNOSIS — I509 Heart failure, unspecified: Secondary | ICD-10-CM | POA: Diagnosis not present

## 2018-04-25 DIAGNOSIS — I420 Dilated cardiomyopathy: Secondary | ICD-10-CM | POA: Diagnosis not present

## 2018-05-09 DIAGNOSIS — Z0189 Encounter for other specified special examinations: Secondary | ICD-10-CM | POA: Diagnosis not present

## 2018-05-09 DIAGNOSIS — I482 Chronic atrial fibrillation: Secondary | ICD-10-CM | POA: Diagnosis not present

## 2018-05-09 DIAGNOSIS — I5042 Chronic combined systolic (congestive) and diastolic (congestive) heart failure: Secondary | ICD-10-CM | POA: Diagnosis not present

## 2018-05-09 DIAGNOSIS — I2510 Atherosclerotic heart disease of native coronary artery without angina pectoris: Secondary | ICD-10-CM | POA: Diagnosis not present

## 2018-06-07 DIAGNOSIS — I10 Essential (primary) hypertension: Secondary | ICD-10-CM | POA: Diagnosis not present

## 2018-06-07 DIAGNOSIS — Z6830 Body mass index (BMI) 30.0-30.9, adult: Secondary | ICD-10-CM | POA: Diagnosis not present

## 2018-06-07 DIAGNOSIS — I2510 Atherosclerotic heart disease of native coronary artery without angina pectoris: Secondary | ICD-10-CM | POA: Diagnosis not present

## 2018-06-07 DIAGNOSIS — I480 Paroxysmal atrial fibrillation: Secondary | ICD-10-CM | POA: Diagnosis not present

## 2018-06-07 DIAGNOSIS — E7849 Other hyperlipidemia: Secondary | ICD-10-CM | POA: Diagnosis not present

## 2018-06-07 DIAGNOSIS — I509 Heart failure, unspecified: Secondary | ICD-10-CM | POA: Diagnosis not present

## 2018-06-07 DIAGNOSIS — E1151 Type 2 diabetes mellitus with diabetic peripheral angiopathy without gangrene: Secondary | ICD-10-CM | POA: Diagnosis not present

## 2018-06-12 DIAGNOSIS — H401131 Primary open-angle glaucoma, bilateral, mild stage: Secondary | ICD-10-CM | POA: Diagnosis not present

## 2018-06-12 DIAGNOSIS — H43813 Vitreous degeneration, bilateral: Secondary | ICD-10-CM | POA: Diagnosis not present

## 2018-06-12 DIAGNOSIS — E119 Type 2 diabetes mellitus without complications: Secondary | ICD-10-CM | POA: Diagnosis not present

## 2018-06-12 DIAGNOSIS — H5213 Myopia, bilateral: Secondary | ICD-10-CM | POA: Diagnosis not present

## 2018-06-30 DIAGNOSIS — Z23 Encounter for immunization: Secondary | ICD-10-CM | POA: Diagnosis not present

## 2018-08-10 DIAGNOSIS — I4821 Permanent atrial fibrillation: Secondary | ICD-10-CM | POA: Diagnosis not present

## 2018-08-10 DIAGNOSIS — I2510 Atherosclerotic heart disease of native coronary artery without angina pectoris: Secondary | ICD-10-CM | POA: Diagnosis not present

## 2018-08-10 DIAGNOSIS — I5042 Chronic combined systolic (congestive) and diastolic (congestive) heart failure: Secondary | ICD-10-CM | POA: Diagnosis not present

## 2018-08-10 DIAGNOSIS — Z0189 Encounter for other specified special examinations: Secondary | ICD-10-CM | POA: Diagnosis not present

## 2018-10-19 DIAGNOSIS — I10 Essential (primary) hypertension: Secondary | ICD-10-CM | POA: Diagnosis not present

## 2018-10-19 DIAGNOSIS — R82998 Other abnormal findings in urine: Secondary | ICD-10-CM | POA: Diagnosis not present

## 2018-10-19 DIAGNOSIS — Z125 Encounter for screening for malignant neoplasm of prostate: Secondary | ICD-10-CM | POA: Diagnosis not present

## 2018-10-19 DIAGNOSIS — E1151 Type 2 diabetes mellitus with diabetic peripheral angiopathy without gangrene: Secondary | ICD-10-CM | POA: Diagnosis not present

## 2018-10-19 DIAGNOSIS — E7849 Other hyperlipidemia: Secondary | ICD-10-CM | POA: Diagnosis not present

## 2018-10-25 DIAGNOSIS — Z6830 Body mass index (BMI) 30.0-30.9, adult: Secondary | ICD-10-CM | POA: Diagnosis not present

## 2018-10-25 DIAGNOSIS — I480 Paroxysmal atrial fibrillation: Secondary | ICD-10-CM | POA: Diagnosis not present

## 2018-10-25 DIAGNOSIS — I509 Heart failure, unspecified: Secondary | ICD-10-CM | POA: Diagnosis not present

## 2018-10-25 DIAGNOSIS — Z0000 Encounter for general adult medical examination without abnormal findings: Secondary | ICD-10-CM | POA: Diagnosis not present

## 2018-10-25 DIAGNOSIS — I10 Essential (primary) hypertension: Secondary | ICD-10-CM | POA: Diagnosis not present

## 2018-10-25 DIAGNOSIS — Z7901 Long term (current) use of anticoagulants: Secondary | ICD-10-CM | POA: Diagnosis not present

## 2018-10-25 DIAGNOSIS — E7849 Other hyperlipidemia: Secondary | ICD-10-CM | POA: Diagnosis not present

## 2018-10-25 DIAGNOSIS — Z1389 Encounter for screening for other disorder: Secondary | ICD-10-CM | POA: Diagnosis not present

## 2018-10-25 DIAGNOSIS — M1990 Unspecified osteoarthritis, unspecified site: Secondary | ICD-10-CM | POA: Diagnosis not present

## 2018-10-25 DIAGNOSIS — I2510 Atherosclerotic heart disease of native coronary artery without angina pectoris: Secondary | ICD-10-CM | POA: Diagnosis not present

## 2018-10-25 DIAGNOSIS — Z23 Encounter for immunization: Secondary | ICD-10-CM | POA: Diagnosis not present

## 2018-10-25 DIAGNOSIS — E1151 Type 2 diabetes mellitus with diabetic peripheral angiopathy without gangrene: Secondary | ICD-10-CM | POA: Diagnosis not present

## 2018-10-25 DIAGNOSIS — N200 Calculus of kidney: Secondary | ICD-10-CM | POA: Diagnosis not present

## 2018-11-01 DIAGNOSIS — Z1212 Encounter for screening for malignant neoplasm of rectum: Secondary | ICD-10-CM | POA: Diagnosis not present

## 2018-11-08 DIAGNOSIS — D1801 Hemangioma of skin and subcutaneous tissue: Secondary | ICD-10-CM | POA: Diagnosis not present

## 2018-11-08 DIAGNOSIS — L821 Other seborrheic keratosis: Secondary | ICD-10-CM | POA: Diagnosis not present

## 2018-11-08 DIAGNOSIS — D225 Melanocytic nevi of trunk: Secondary | ICD-10-CM | POA: Diagnosis not present

## 2018-11-08 DIAGNOSIS — Z85820 Personal history of malignant melanoma of skin: Secondary | ICD-10-CM | POA: Diagnosis not present

## 2018-11-08 DIAGNOSIS — L814 Other melanin hyperpigmentation: Secondary | ICD-10-CM | POA: Diagnosis not present

## 2018-12-03 ENCOUNTER — Other Ambulatory Visit: Payer: Self-pay | Admitting: Cardiology

## 2018-12-11 DIAGNOSIS — H401131 Primary open-angle glaucoma, bilateral, mild stage: Secondary | ICD-10-CM | POA: Diagnosis not present

## 2019-01-31 ENCOUNTER — Ambulatory Visit: Payer: Self-pay | Admitting: Cardiology

## 2019-02-24 ENCOUNTER — Other Ambulatory Visit: Payer: Self-pay | Admitting: Cardiology

## 2019-03-04 ENCOUNTER — Ambulatory Visit: Payer: Self-pay | Admitting: Cardiology

## 2019-05-01 ENCOUNTER — Ambulatory Visit: Payer: Self-pay | Admitting: Cardiology

## 2019-05-02 ENCOUNTER — Other Ambulatory Visit: Payer: Self-pay

## 2019-05-02 ENCOUNTER — Ambulatory Visit (INDEPENDENT_AMBULATORY_CARE_PROVIDER_SITE_OTHER): Payer: PPO | Admitting: Cardiology

## 2019-05-02 ENCOUNTER — Encounter: Payer: Self-pay | Admitting: Cardiology

## 2019-05-02 VITALS — BP 108/81 | HR 81 | Ht 69.0 in | Wt 202.0 lb

## 2019-05-02 DIAGNOSIS — I480 Paroxysmal atrial fibrillation: Secondary | ICD-10-CM

## 2019-05-02 DIAGNOSIS — I10 Essential (primary) hypertension: Secondary | ICD-10-CM

## 2019-05-02 NOTE — Progress Notes (Signed)
Follow up visit  Subjective:   Brandon Morrow, male    DOB: 09/11/1946, 73 y.o.   MRN: [id number]   Chief Complaint  Patient presents with  . Hypertension  . Atrial Fibrillation  . Follow-up    HPI  73 year old male with hypertension, hyperlipidemia, uncontrolled type II diabetes mellitus, persistent atrial fibrillation, coronary artery disease status post proximal to mid LAD PCI 3.5 x 22 mm resolute onyx drug-eluting stent in 01/2017, nonischemic cardiomyopathy  Patient is doing well and denies chest pain, shortness of breath, palpitations, leg edema, orthopnea, PND, TIA/syncope. His physical activity has been limited during the pandemic. Blood pressure is well controlled.   Past Medical History:  Diagnosis Date  . Arthritis    "starting in my knees, hands/fingers" (01/31/2017)  . CHF (congestive heart failure) ([place])   . Coronary artery disease   . GERD (gastroesophageal reflux disease)   . High cholesterol   . Hypertension   . Melanoma ([place]) 2010   "left upper arm"   . Pneumonia ~ 1990s  . Type II diabetes mellitus ([place])      Past Surgical History:  Procedure Laterality Date  . CARDIOVERSION N/A 10/25/2016   Procedure: CARDIOVERSION;  Surgeon: Adrian Prows, MD;  Location: [facility];  Service: Cardiovascular;  Laterality: N/A;  . CATARACT EXTRACTION W/ INTRAOCULAR LENS  IMPLANT, BILATERAL Bilateral   . COLONOSCOPY  last one 10-07-2015  . CORONARY ANGIOPLASTY WITH STENT PLACEMENT    . CORONARY STENT INTERVENTION N/A 01/31/2017   Procedure: Coronary Stent Intervention;  Surgeon: Adrian Prows, MD;  Location: [facility];  Service: Cardiovascular;  Laterality: N/A;  prox lad  . CYSTOSCOPY WITH RETROGRADE PYELOGRAM, URETEROSCOPY AND STENT PLACEMENT Left 10/12/2015   Procedure: LEFT RETROGRADE PYELOGRAM, LEFT URETEROSCOPY, LASER LITHOTRIPSY  AND STENT PLACEMENT;  Surgeon: Kathie Rhodes, MD;  Location: [facility];  Service: Urology;  Laterality: Left;  .  HOLMIUM LASER APPLICATION Left 99/83/3825   Procedure: HOLMIUM LASER APPLICATION;  Surgeon: Kathie Rhodes, MD;  Location: [facility];  Service: Urology;  Laterality: Left;  . KNEE ARTHROSCOPY Bilateral 2000s  . LEFT HEART CATH AND CORONARY ANGIOGRAPHY N/A 01/31/2017   Procedure: Left Heart Cath and Coronary Angiography;  Surgeon: Adrian Prows, MD;  Location: [facility];  Service: Cardiovascular;  Laterality: N/A;  . LITHOTRIPSY  2016  . MELANOMA EXCISION Left 2010   "upper arm"   . NEGATIVE SLEEP STUDY  YRS AGO per pt     Social History   Socioeconomic History  . Marital status: Married    Spouse name: Not on file  . Number of children: Not on file  . Years of education: Not on file  . Highest education level: Not on file  Occupational History  . Not on file  Social Needs  . Financial resource strain: Not on file  . Food insecurity    Worry: Not on file    Inability: Not on file  . Transportation needs    Medical: Not on file    Non-medical: Not on file  Tobacco Use  . Smoking status: Former Smoker    Packs/day: 0.50    Years: 24.00    Pack years: 12.00    Types: Cigarettes    Quit date: 11/24/1986    Years since quitting: 32.4  . Smokeless tobacco: Never Used  Substance and Sexual Activity  . Alcohol use: Yes    Comment: 01/31/2017 "maybe 1 drink/month"  . Drug use: No  .  Sexual activity: Not Currently  Lifestyle  . Physical activity    Days per week: Not on file    Minutes per session: Not on file  . Stress: Not on file  Relationships  . Social Herbalist on phone: Not on file    Gets together: Not on file    Attends religious service: Not on file    Active member of club or organization: Not on file    Attends meetings of clubs or organizations: Not on file    Relationship status: Not on file  . Intimate partner violence    Fear of current or ex partner: Not on file    Emotionally abused: Not on file    Physically abused: Not on  file    Forced sexual activity: Not on file  Other Topics Concern  . Not on file  Social History Narrative  . Not on file     Current Outpatient Medications on File Prior to Visit  Medication Sig Dispense Refill  . acetaminophen (TYLENOL) 500 MG tablet Take 1,000 mg by mouth at bedtime as needed for moderate pain or headache.     Marland Kitchen apixaban (ELIQUIS) 5 MG TABS tablet Take 5 mg by mouth 2 (two) times daily.    Marland Kitchen atorvastatin (LIPITOR) 10 MG tablet Take 5 mg by mouth daily.   1  . calcium carbonate (TUMS - DOSED IN MG ELEMENTAL CALCIUM) 500 MG chewable tablet Chew 2 tablets by mouth daily as needed for indigestion or heartburn.    . Cholecalciferol (VITAMIN D3) 400 UNITS CAPS Take 400 Units by mouth at bedtime.     . clopidogrel (PLAVIX) 75 MG tablet Take 1 tablet (75 mg total) by mouth daily with breakfast. 90 tablet 2  . diphenhydramine-acetaminophen (TYLENOL PM) 25-500 MG TABS tablet Take 2 tablets by mouth at bedtime as needed (sleep).    . furosemide (LASIX) 20 MG tablet Take 20 mg by mouth daily.    Marland Kitchen latanoprost (XALATAN) 0.005 % ophthalmic solution Place 1 drop into both eyes at bedtime.   11  . loratadine (CLARITIN) 10 MG tablet Take 10 mg by mouth at bedtime.     . metFORMIN (GLUCOPHAGE) 1000 MG tablet Take 1 tablet (1,000 mg total) by mouth 2 (two) times daily.  5  . metoprolol succinate (TOPROL-XL) 100 MG 24 hr tablet TAKE 1 TABLET BY MOUTH EVERY DAY 90 tablet 5  . Multiple Vitamins-Minerals (MULTIVITAMIN ADULT PO) Take 1 tablet by mouth daily.    . nebivolol (BYSTOLIC) 10 MG tablet Take 10 mg by mouth daily.    Marland Kitchen spironolactone (ALDACTONE) 25 MG tablet TAKE 1 TABLET BY MOUTH EVERY DAY 60 tablet 3  . valsartan (DIOVAN) 160 MG tablet Take 160 mg by mouth daily.     No current facility-administered medications on file prior to visit.     Cardiovascular studies:  EKG 05/02/2019: Atrial fibrillation, ventricualr rate 103 bpm. Othwerwise normal EKG.   Echocardiogram  04/25/2018: Left ventricle cavity is normal in size. Mild concentric hypertrophy of the left ventricle. Moderate decrease in LV systolic function with global hypokinesis. Cannot exclude apical anterior hypokinesis.  Visual EF is 35-40%. Indeterminate diastolic filling pattern due to A. Fib.  Left atrial cavity is moderately dilated at 4.7 cm. Moderate (Grade II) mitral regurgitation. Trace tricuspid regurgitation. Unable to estimate PA pressure due to absence/minimal TR signal. Compared to the study done on 10/26/2017, no significant change.  Coronary angiogram 01/31/2017:  Proximal to mid LAD  high-grade 90% stenosis, mildly calcified. Very minimal luminal irregularity in the circumflex and right coronary artery. Left ventricle mildly dilated, global hypokinesis, EF around 25%. Mildly elevated EDP of 18 mmHg. S/P stenting of proximal and mid LAD with 3.5 x 22 mm resolute onyx DES, stenosis reduced from 90% to 0% with maintenance of TIMI-3 to TIMI-3 flow.  Exercise sestamibi stress test 09/26/2016: 1. The resting electrocardiogram demonstrated atrial fibrillation, normal resting conduction and normal rest repolarization.  The stress electrocardiogram was normal.  Patient exercised on Bruce protocol for 5:02 minutes and achieved 4.64 METS. Stress test terminated due to 124% MPHR achieved (Target HR >85%), no symptoms. 2.  The perfusion imaging study demonstrates mild diaphragmatic attenuation artifact, no evidence of ischemia or scar.  LV systolic function calculated by QGS was mild to moderately depressed at 41% with global hypokinesis. Findings may suggest nonischemic cardiomyopathy.  This is an intermediate risk study.   Recent labs: Labs 10/19/2018: BUN/Cr 12/1.0. eGFR >60. H/H 17/53. Platelets 230. HbA1C 7.6% Chol 151, TG 232, HDL 34. LDL 71.   Review of Systems  Constitution: Negative for decreased appetite, malaise/fatigue, weight gain and weight loss.  HENT: Negative for congestion.    Eyes: Negative for visual disturbance.  Cardiovascular: Negative for chest pain, dyspnea on exertion, leg swelling, palpitations and syncope.  Respiratory: Negative for cough.   Endocrine: Negative for cold intolerance.  Hematologic/Lymphatic: Does not bruise/bleed easily.  Skin: Negative for itching and rash.  Musculoskeletal: Negative for myalgias.  Gastrointestinal: Negative for abdominal pain, nausea and vomiting.  Genitourinary: Negative for dysuria.  Neurological: Negative for dizziness and weakness.  Psychiatric/Behavioral: The patient is not nervous/anxious.   All other systems reviewed and are negative.        Vitals:   05/02/19 1329  BP: 108/81  Pulse: 81  SpO2: 97%    Body mass index is 34.41 kg/m. Filed Weights   05/02/19 1329  Weight: 233 lb (105.7 kg)    Objective:   Physical Exam  Constitutional: He is oriented to person, place, and time. He appears well-developed and well-nourished. No distress.  HENT:  Head: Normocephalic and atraumatic.  Eyes: Pupils are equal, round, and reactive to light. Conjunctivae are normal.  Neck: No JVD present.  Cardiovascular: Normal rate and intact distal pulses. An irregularly irregular rhythm present.  Pulmonary/Chest: Effort normal and breath sounds normal. He has no wheezes. He has no rales.  Abdominal: Soft. Bowel sounds are normal. There is no rebound.  Musculoskeletal:        General: No edema.  Lymphadenopathy:    He has no cervical adenopathy.  Neurological: He is alert and oriented to person, place, and time. No cranial nerve deficit.  Skin: Skin is warm and dry.  Psychiatric: He has a normal mood and affect.  Nursing note and vitals reviewed.       Assessment & Recommendations:   73 year old male with hypertension, hyperlipidemia, uncontrolled type II diabetes mellitus, persistent atrial fibrillation, coronary artery disease status post proximal to mid LAD PCI 3.5 x 22 mm resolute onyx drug-eluting stent  in 01/2017, nonischemic cardiomyopathy  Atrial fibrillation: Permanet Afib with controlled ventricular rate and no significant symptoms. Continue eliquis 5 mg bid for anticoagulation.  Nonischemic cardiomyopathy: NYHA class I/II symptoms. EF improved to 40-45%. Continue entresto, metoprolol succinate, spironolactone 12.5 mg daily.   CAD: No angina symptoms. Lipid well controled.   Follow up in 1 year.    Nigel Mormon, MD [facility] Pager: [phone] Office: [phone]  If no answer Cell [phone]

## 2019-05-07 ENCOUNTER — Other Ambulatory Visit: Payer: Self-pay

## 2019-06-12 DIAGNOSIS — H524 Presbyopia: Secondary | ICD-10-CM | POA: Diagnosis not present

## 2019-06-12 DIAGNOSIS — H1789 Other corneal scars and opacities: Secondary | ICD-10-CM | POA: Diagnosis not present

## 2019-06-12 DIAGNOSIS — E119 Type 2 diabetes mellitus without complications: Secondary | ICD-10-CM | POA: Diagnosis not present

## 2019-06-12 DIAGNOSIS — H401131 Primary open-angle glaucoma, bilateral, mild stage: Secondary | ICD-10-CM | POA: Diagnosis not present

## 2019-07-05 DIAGNOSIS — Z23 Encounter for immunization: Secondary | ICD-10-CM | POA: Diagnosis not present

## 2019-09-26 DIAGNOSIS — E1151 Type 2 diabetes mellitus with diabetic peripheral angiopathy without gangrene: Secondary | ICD-10-CM | POA: Diagnosis not present

## 2019-11-07 DIAGNOSIS — E1151 Type 2 diabetes mellitus with diabetic peripheral angiopathy without gangrene: Secondary | ICD-10-CM | POA: Diagnosis not present

## 2019-11-07 DIAGNOSIS — E7849 Other hyperlipidemia: Secondary | ICD-10-CM | POA: Diagnosis not present

## 2019-11-07 DIAGNOSIS — R82998 Other abnormal findings in urine: Secondary | ICD-10-CM | POA: Diagnosis not present

## 2019-11-07 DIAGNOSIS — I10 Essential (primary) hypertension: Secondary | ICD-10-CM | POA: Diagnosis not present

## 2019-11-07 DIAGNOSIS — Z125 Encounter for screening for malignant neoplasm of prostate: Secondary | ICD-10-CM | POA: Diagnosis not present

## 2019-11-08 DIAGNOSIS — M1990 Unspecified osteoarthritis, unspecified site: Secondary | ICD-10-CM | POA: Diagnosis not present

## 2019-11-08 DIAGNOSIS — E785 Hyperlipidemia, unspecified: Secondary | ICD-10-CM | POA: Diagnosis not present

## 2019-11-08 DIAGNOSIS — N200 Calculus of kidney: Secondary | ICD-10-CM | POA: Diagnosis not present

## 2019-11-08 DIAGNOSIS — E1151 Type 2 diabetes mellitus with diabetic peripheral angiopathy without gangrene: Secondary | ICD-10-CM | POA: Diagnosis not present

## 2019-11-08 DIAGNOSIS — Z0000 Encounter for general adult medical examination without abnormal findings: Secondary | ICD-10-CM | POA: Diagnosis not present

## 2019-11-08 DIAGNOSIS — I509 Heart failure, unspecified: Secondary | ICD-10-CM | POA: Diagnosis not present

## 2019-11-08 DIAGNOSIS — I10 Essential (primary) hypertension: Secondary | ICD-10-CM | POA: Diagnosis not present

## 2019-11-08 DIAGNOSIS — Z1331 Encounter for screening for depression: Secondary | ICD-10-CM | POA: Diagnosis not present

## 2019-11-08 DIAGNOSIS — Z7901 Long term (current) use of anticoagulants: Secondary | ICD-10-CM | POA: Diagnosis not present

## 2019-11-08 DIAGNOSIS — I480 Paroxysmal atrial fibrillation: Secondary | ICD-10-CM | POA: Diagnosis not present

## 2019-11-08 DIAGNOSIS — I2510 Atherosclerotic heart disease of native coronary artery without angina pectoris: Secondary | ICD-10-CM | POA: Diagnosis not present

## 2019-11-08 DIAGNOSIS — C439 Malignant melanoma of skin, unspecified: Secondary | ICD-10-CM | POA: Diagnosis not present

## 2019-11-08 DIAGNOSIS — D126 Benign neoplasm of colon, unspecified: Secondary | ICD-10-CM | POA: Diagnosis not present

## 2019-11-11 ENCOUNTER — Encounter: Payer: Self-pay | Admitting: Cardiology

## 2019-11-19 DIAGNOSIS — Z1212 Encounter for screening for malignant neoplasm of rectum: Secondary | ICD-10-CM | POA: Diagnosis not present

## 2019-12-01 ENCOUNTER — Ambulatory Visit: Payer: PPO | Attending: Internal Medicine

## 2019-12-01 DIAGNOSIS — Z23 Encounter for immunization: Secondary | ICD-10-CM

## 2019-12-01 NOTE — Progress Notes (Signed)
   Covid-19 Vaccination Clinic  Name:  Brandon Morrow    MRN: [id number] DOB: May 09, 1946  12/01/2019  Mr. Brandon Morrow was observed post Covid-19 immunization for 15 minutes without incidence. He was provided with Vaccine Information Sheet and instruction to access the V-Safe system.   Mr. Brandon Morrow was instructed to call 911 with any severe reactions post vaccine: Marland Kitchen Difficulty breathing  . Swelling of your face and throat  . A fast heartbeat  . A bad rash all over your body  . Dizziness and weakness    Immunizations Administered    Name Date Dose VIS Date Route   Pfizer COVID-19 Vaccine 12/01/2019  2:05 PM 0.3 mL 10/04/2019 Intramuscular   Manufacturer: [place]   Lot: [id number]   [place]: [id number]

## 2019-12-16 ENCOUNTER — Ambulatory Visit: Payer: PPO

## 2019-12-25 ENCOUNTER — Ambulatory Visit: Payer: PPO

## 2019-12-25 ENCOUNTER — Ambulatory Visit: Payer: PPO | Attending: Internal Medicine

## 2019-12-25 DIAGNOSIS — Z23 Encounter for immunization: Secondary | ICD-10-CM

## 2019-12-25 NOTE — Progress Notes (Signed)
   Covid-19 Vaccination Clinic  Name:  SIRRON LOGA    MRN: [id number] DOB: 10-01-1946  12/25/2019  Mr. Auvil was observed post Covid-19 immunization for 15 minutes without incident. He was provided with Vaccine Information Sheet and instruction to access the V-Safe system.   Mr. Gantz was instructed to call 911 with any severe reactions post vaccine: Marland Kitchen Difficulty breathing  . Swelling of face and throat  . A fast heartbeat  . A bad rash all over body  . Dizziness and weakness   Immunizations Administered    Name Date Dose VIS Date Route   Pfizer COVID-19 Vaccine 12/25/2019  9:39 AM 0.3 mL 10/04/2019 Intramuscular   Manufacturer: [place]   Lot: [id number]   [place]: [id number]

## 2020-02-06 ENCOUNTER — Other Ambulatory Visit: Payer: Self-pay

## 2020-02-06 MED ORDER — SPIRONOLACTONE 25 MG PO TABS: 12.5000 mg | ORAL_TABLET | Freq: Every day | ORAL | 1 refills | Status: DC

## 2020-03-02 ENCOUNTER — Other Ambulatory Visit: Payer: Self-pay | Admitting: Cardiology

## 2020-03-13 DIAGNOSIS — H401131 Primary open-angle glaucoma, bilateral, mild stage: Secondary | ICD-10-CM | POA: Diagnosis not present

## 2020-03-17 DIAGNOSIS — I129 Hypertensive chronic kidney disease with stage 1 through stage 4 chronic kidney disease, or unspecified chronic kidney disease: Secondary | ICD-10-CM | POA: Diagnosis not present

## 2020-03-17 DIAGNOSIS — M79673 Pain in unspecified foot: Secondary | ICD-10-CM | POA: Diagnosis not present

## 2020-03-17 DIAGNOSIS — E1151 Type 2 diabetes mellitus with diabetic peripheral angiopathy without gangrene: Secondary | ICD-10-CM | POA: Diagnosis not present

## 2020-03-17 DIAGNOSIS — I480 Paroxysmal atrial fibrillation: Secondary | ICD-10-CM | POA: Diagnosis not present

## 2020-03-17 DIAGNOSIS — I2510 Atherosclerotic heart disease of native coronary artery without angina pectoris: Secondary | ICD-10-CM | POA: Diagnosis not present

## 2020-03-17 DIAGNOSIS — N1831 Chronic kidney disease, stage 3a: Secondary | ICD-10-CM | POA: Diagnosis not present

## 2020-03-17 DIAGNOSIS — Z7901 Long term (current) use of anticoagulants: Secondary | ICD-10-CM | POA: Diagnosis not present

## 2020-03-17 DIAGNOSIS — M1990 Unspecified osteoarthritis, unspecified site: Secondary | ICD-10-CM | POA: Diagnosis not present

## 2020-04-30 ENCOUNTER — Ambulatory Visit: Payer: PPO | Admitting: Cardiology

## 2020-04-30 ENCOUNTER — Encounter: Payer: Self-pay | Admitting: Cardiology

## 2020-04-30 ENCOUNTER — Other Ambulatory Visit: Payer: Self-pay

## 2020-04-30 VITALS — BP 135/80 | HR 83 | Resp 17 | Ht 69.0 in | Wt 203.0 lb

## 2020-04-30 DIAGNOSIS — R2681 Unsteadiness on feet: Secondary | ICD-10-CM | POA: Insufficient documentation

## 2020-04-30 DIAGNOSIS — E1165 Type 2 diabetes mellitus with hyperglycemia: Secondary | ICD-10-CM | POA: Insufficient documentation

## 2020-04-30 DIAGNOSIS — I10 Essential (primary) hypertension: Secondary | ICD-10-CM | POA: Diagnosis not present

## 2020-04-30 DIAGNOSIS — I4811 Longstanding persistent atrial fibrillation: Secondary | ICD-10-CM | POA: Diagnosis not present

## 2020-04-30 DIAGNOSIS — I2510 Atherosclerotic heart disease of native coronary artery without angina pectoris: Secondary | ICD-10-CM | POA: Insufficient documentation

## 2020-04-30 NOTE — Progress Notes (Signed)
Follow up visit  Subjective:   Brandon Morrow, male    DOB: 1946/06/08, 74 y.o.   MRN: [id number]   Chief Complaint  Patient presents with   Atrial Fibrillation   Follow-up    1 years    HPI  74 year old male with hypertension, hyperlipidemia, uncontrolled type II diabetes mellitus, persistent atrial fibrillation, coronary artery disease status post proximal to mid LAD PCI 3.5 x 22 mm resolute onyx drug-eluting stent in 01/2017, nonischemic cardiomyopathy  Patient denies chest pain, shortness of breath, palpitations, orthopnea, PND, TIA/syncope. He does notice leg edema, discovered during physial exam. He does not do much physical activity, limited by his gait instability. He denies any dizziness, but does endorse numbness in his feet. His DM is uncontrolled, with A1C in 10/2019 being 7.5%. He has not discussed the above complaints with his PCP Dr. Dagmar Hait.     Current Outpatient Medications on File Prior to Visit  Medication Sig Dispense Refill   acetaminophen (TYLENOL) 500 MG tablet Take 500 mg by mouth 3 (three) times daily.      apixaban (ELIQUIS) 5 MG TABS tablet Take 5 mg by mouth 2 (two) times daily.     atorvastatin (LIPITOR) 10 MG tablet Take 5 mg by mouth daily.   1   calcium carbonate (TUMS - DOSED IN MG ELEMENTAL CALCIUM) 500 MG chewable tablet Chew 2 tablets by mouth daily as needed for indigestion or heartburn.     Cholecalciferol (VITAMIN D3) 400 UNITS CAPS Take 400 Units by mouth in the morning and at bedtime.      furosemide (LASIX) 20 MG tablet Take 20 mg by mouth daily.     latanoprost (XALATAN) 0.005 % ophthalmic solution Place 1 drop into both eyes at bedtime.   11   loratadine (CLARITIN) 10 MG tablet Take 10 mg by mouth at bedtime.      metFORMIN (GLUCOPHAGE) 1000 MG tablet Take 1 tablet (1,000 mg total) by mouth 2 (two) times daily.  5   metoprolol succinate (TOPROL-XL) 100 MG 24 hr tablet TAKE 1 TABLET BY MOUTH EVERY DAY 90 tablet 5   Multiple  Vitamins-Minerals (MULTIVITAMIN ADULT PO) Take 1 tablet by mouth daily.     sacubitril-valsartan (ENTRESTO) 97-103 MG Take 1 tablet by mouth 2 (two) times daily.     spironolactone (ALDACTONE) 25 MG tablet Take 0.5 tablets (12.5 mg total) by mouth daily. 90 tablet 1   No current facility-administered medications on file prior to visit.    Cardiovascular studies:  EKG 04/30/2020: Atrial fibrillation 105 bpm Frequent PVC's Diffuse nonspecific T-abnormality  Echocardiogram 04/25/2018: Left ventricle cavity is normal in size. Mild concentric hypertrophy of the left ventricle. Moderate decrease in LV systolic function with global hypokinesis. Cannot exclude apical anterior hypokinesis.  Visual EF is 35-40%. Indeterminate diastolic filling pattern due to A. Fib.  Left atrial cavity is moderately dilated at 4.7 cm. Moderate (Grade II) mitral regurgitation. Trace tricuspid regurgitation. Unable to estimate PA pressure due to absence/minimal TR signal. Compared to the study done on 10/26/2017, no significant change.  Coronary angiogram 01/31/2017:  Proximal to mid LAD high-grade 90% stenosis, mildly calcified. Very minimal luminal irregularity in the circumflex and right coronary artery. Left ventricle mildly dilated, global hypokinesis, EF around 25%. Mildly elevated EDP of 18 mmHg. S/P stenting of proximal and mid LAD with 3.5 x 22 mm resolute onyx DES, stenosis reduced from 90% to 0% with maintenance of TIMI-3 to TIMI-3 flow.  Exercise sestamibi stress test 09/26/2016: 1.  The resting electrocardiogram demonstrated atrial fibrillation, normal resting conduction and normal rest repolarization.  The stress electrocardiogram was normal.  Patient exercised on Bruce protocol for 5:02 minutes and achieved 4.64 METS. Stress test terminated due to 124% MPHR achieved (Target HR >85%), no symptoms. 2.  The perfusion imaging study demonstrates mild diaphragmatic attenuation artifact, no evidence of ischemia  or scar.  LV systolic function calculated by QGS was mild to moderately depressed at 41% with global hypokinesis. Findings may suggest nonischemic cardiomyopathy.  This is an intermediate risk study.   Recent labs: 11/07/2019: Glucose 140, BUN/Cr 16/1.2. EGFR 59.  HbA1C 7.5% Chol 138, TG 235, HDL 31, LDL 60 TSH 1.4 normal  Labs 10/19/2018: BUN/Cr 12/1.0. eGFR >60. H/H 17/53. Platelets 230. HbA1C 7.6% Chol 151, TG 232, HDL 34. LDL 71.   Review of Systems  Cardiovascular: Positive for leg swelling. Negative for chest pain, dyspnea on exertion, palpitations and syncope.  Neurological:       Gait instability          Vitals:   04/30/20 1133 04/30/20 1134  BP: (!) 140/91 135/80  Pulse: 79 83  Resp: 17   SpO2: 96%      Body mass index is 29.98 kg/m. Filed Weights   04/30/20 1133  Weight: 203 lb (92.1 kg)    Objective:   Physical Exam Vitals and nursing note reviewed.  Constitutional:      General: He is not in acute distress. Neck:     Vascular: No JVD.  Cardiovascular:     Rate and Rhythm: Normal rate. Rhythm irregular.     Heart sounds: Normal heart sounds. No murmur heard.   Pulmonary:     Effort: Pulmonary effort is normal.     Breath sounds: Normal breath sounds. No wheezing or rales.  Musculoskeletal:     Right lower leg: Edema (1+) present.     Left lower leg: Edema (1+) present.         Assessment & Recommendations:   74 year old male with hypertension, hyperlipidemia, uncontrolled type II diabetes mellitus, persistent atrial fibrillation, coronary artery disease status post proximal to mid LAD PCI 3.5 x 22 mm resolute onyx drug-eluting stent in 01/2017, nonischemic cardiomyopathy  Persistent atrial fibrillation: Controlled ventricular rate and no significant symptoms.  CHA2DS2VASc score 4, annual stroke risk 5% Continue eliquis 5 mg bid   Nonischemic cardiomyopathy: EF 35-40% in 2019. With his leg edema, will check echocardiogram. Continue  entresto 97-103 mg bid,, metoprolol succinate 100 mg daily, spironolactone 12.5 mg daily.   CAD: No angina symptoms. Lipid well controled.  Not on Aspirin due to ongoing use of eliquis. In light of uncontrolled DM and new leg edema, I recommended starting Jardiance 10 mg daily. He would like to think about it.   Hyperlipidemia: LDL 60, but TG 235 in 10/2018 while on Lipitor 10 mg. Suspect high TG is due to uncontrolled DM.  Gait instability: I suspect he may have diabetic neuropathy. Recommend f/u w/Dr. Dagmar Hait.   'F/u in 4 weeks   Jesaiah Fabiano Esther Hardy, MD [facility] [place] Pager: [phone] Office: [phone] If no answer Cell [phone]

## 2020-05-04 ENCOUNTER — Ambulatory Visit: Payer: PPO

## 2020-05-04 ENCOUNTER — Other Ambulatory Visit: Payer: Self-pay

## 2020-05-04 DIAGNOSIS — I4811 Longstanding persistent atrial fibrillation: Secondary | ICD-10-CM | POA: Diagnosis not present

## 2020-05-04 DIAGNOSIS — I2510 Atherosclerotic heart disease of native coronary artery without angina pectoris: Secondary | ICD-10-CM

## 2020-06-03 ENCOUNTER — Ambulatory Visit: Payer: PPO | Admitting: Cardiology

## 2020-06-03 DIAGNOSIS — I4811 Longstanding persistent atrial fibrillation: Secondary | ICD-10-CM | POA: Insufficient documentation

## 2020-06-03 NOTE — Progress Notes (Signed)
No show

## 2020-06-25 DIAGNOSIS — H2000 Unspecified acute and subacute iridocyclitis: Secondary | ICD-10-CM | POA: Diagnosis not present

## 2020-06-26 DIAGNOSIS — H2000 Unspecified acute and subacute iridocyclitis: Secondary | ICD-10-CM | POA: Diagnosis not present

## 2020-06-30 DIAGNOSIS — H2000 Unspecified acute and subacute iridocyclitis: Secondary | ICD-10-CM | POA: Diagnosis not present

## 2020-07-25 DIAGNOSIS — Z23 Encounter for immunization: Secondary | ICD-10-CM | POA: Diagnosis not present

## 2020-08-18 DIAGNOSIS — N1831 Chronic kidney disease, stage 3a: Secondary | ICD-10-CM | POA: Diagnosis not present

## 2020-08-18 DIAGNOSIS — Z7901 Long term (current) use of anticoagulants: Secondary | ICD-10-CM | POA: Diagnosis not present

## 2020-08-18 DIAGNOSIS — I480 Paroxysmal atrial fibrillation: Secondary | ICD-10-CM | POA: Diagnosis not present

## 2020-08-18 DIAGNOSIS — M1990 Unspecified osteoarthritis, unspecified site: Secondary | ICD-10-CM | POA: Diagnosis not present

## 2020-08-18 DIAGNOSIS — E1151 Type 2 diabetes mellitus with diabetic peripheral angiopathy without gangrene: Secondary | ICD-10-CM | POA: Diagnosis not present

## 2020-08-18 DIAGNOSIS — M79673 Pain in unspecified foot: Secondary | ICD-10-CM | POA: Diagnosis not present

## 2020-08-18 DIAGNOSIS — I2510 Atherosclerotic heart disease of native coronary artery without angina pectoris: Secondary | ICD-10-CM | POA: Diagnosis not present

## 2020-08-18 DIAGNOSIS — I129 Hypertensive chronic kidney disease with stage 1 through stage 4 chronic kidney disease, or unspecified chronic kidney disease: Secondary | ICD-10-CM | POA: Diagnosis not present

## 2020-09-07 DIAGNOSIS — H43813 Vitreous degeneration, bilateral: Secondary | ICD-10-CM | POA: Diagnosis not present

## 2020-09-07 DIAGNOSIS — E119 Type 2 diabetes mellitus without complications: Secondary | ICD-10-CM | POA: Diagnosis not present

## 2020-09-07 DIAGNOSIS — H401131 Primary open-angle glaucoma, bilateral, mild stage: Secondary | ICD-10-CM | POA: Diagnosis not present

## 2020-09-07 DIAGNOSIS — H524 Presbyopia: Secondary | ICD-10-CM | POA: Diagnosis not present

## 2020-11-13 DIAGNOSIS — E1151 Type 2 diabetes mellitus with diabetic peripheral angiopathy without gangrene: Secondary | ICD-10-CM | POA: Diagnosis not present

## 2020-11-13 DIAGNOSIS — E785 Hyperlipidemia, unspecified: Secondary | ICD-10-CM | POA: Diagnosis not present

## 2020-11-13 DIAGNOSIS — Z125 Encounter for screening for malignant neoplasm of prostate: Secondary | ICD-10-CM | POA: Diagnosis not present

## 2020-11-19 DIAGNOSIS — R82998 Other abnormal findings in urine: Secondary | ICD-10-CM | POA: Diagnosis not present

## 2020-11-19 DIAGNOSIS — K921 Melena: Secondary | ICD-10-CM | POA: Diagnosis not present

## 2020-11-20 DIAGNOSIS — Z1339 Encounter for screening examination for other mental health and behavioral disorders: Secondary | ICD-10-CM | POA: Diagnosis not present

## 2020-11-20 DIAGNOSIS — I480 Paroxysmal atrial fibrillation: Secondary | ICD-10-CM | POA: Diagnosis not present

## 2020-11-20 DIAGNOSIS — Z7901 Long term (current) use of anticoagulants: Secondary | ICD-10-CM | POA: Diagnosis not present

## 2020-11-20 DIAGNOSIS — M1990 Unspecified osteoarthritis, unspecified site: Secondary | ICD-10-CM | POA: Diagnosis not present

## 2020-11-20 DIAGNOSIS — Z0000 Encounter for general adult medical examination without abnormal findings: Secondary | ICD-10-CM | POA: Diagnosis not present

## 2020-11-20 DIAGNOSIS — E1151 Type 2 diabetes mellitus with diabetic peripheral angiopathy without gangrene: Secondary | ICD-10-CM | POA: Diagnosis not present

## 2020-11-20 DIAGNOSIS — M79673 Pain in unspecified foot: Secondary | ICD-10-CM | POA: Diagnosis not present

## 2020-11-20 DIAGNOSIS — Z1331 Encounter for screening for depression: Secondary | ICD-10-CM | POA: Diagnosis not present

## 2020-11-20 DIAGNOSIS — R195 Other fecal abnormalities: Secondary | ICD-10-CM | POA: Diagnosis not present

## 2020-11-20 DIAGNOSIS — I10 Essential (primary) hypertension: Secondary | ICD-10-CM | POA: Diagnosis not present

## 2020-11-20 DIAGNOSIS — N1831 Chronic kidney disease, stage 3a: Secondary | ICD-10-CM | POA: Diagnosis not present

## 2020-11-20 DIAGNOSIS — I2510 Atherosclerotic heart disease of native coronary artery without angina pectoris: Secondary | ICD-10-CM | POA: Diagnosis not present

## 2021-01-15 DIAGNOSIS — K921 Melena: Secondary | ICD-10-CM | POA: Diagnosis not present

## 2021-01-15 DIAGNOSIS — Z86010 Personal history of colonic polyps: Secondary | ICD-10-CM | POA: Diagnosis not present

## 2021-01-24 ENCOUNTER — Other Ambulatory Visit: Payer: Self-pay | Admitting: Cardiology

## 2021-03-01 DIAGNOSIS — I2510 Atherosclerotic heart disease of native coronary artery without angina pectoris: Secondary | ICD-10-CM | POA: Diagnosis not present

## 2021-03-01 DIAGNOSIS — I480 Paroxysmal atrial fibrillation: Secondary | ICD-10-CM | POA: Diagnosis not present

## 2021-03-01 DIAGNOSIS — N1831 Chronic kidney disease, stage 3a: Secondary | ICD-10-CM | POA: Diagnosis not present

## 2021-03-01 DIAGNOSIS — E1151 Type 2 diabetes mellitus with diabetic peripheral angiopathy without gangrene: Secondary | ICD-10-CM | POA: Diagnosis not present

## 2021-03-01 DIAGNOSIS — M1990 Unspecified osteoarthritis, unspecified site: Secondary | ICD-10-CM | POA: Diagnosis not present

## 2021-03-01 DIAGNOSIS — R195 Other fecal abnormalities: Secondary | ICD-10-CM | POA: Diagnosis not present

## 2021-03-01 DIAGNOSIS — Z7901 Long term (current) use of anticoagulants: Secondary | ICD-10-CM | POA: Diagnosis not present

## 2021-03-01 DIAGNOSIS — I129 Hypertensive chronic kidney disease with stage 1 through stage 4 chronic kidney disease, or unspecified chronic kidney disease: Secondary | ICD-10-CM | POA: Diagnosis not present

## 2021-03-08 DIAGNOSIS — H401131 Primary open-angle glaucoma, bilateral, mild stage: Secondary | ICD-10-CM | POA: Diagnosis not present

## 2021-03-24 DIAGNOSIS — K5730 Diverticulosis of large intestine without perforation or abscess without bleeding: Secondary | ICD-10-CM | POA: Diagnosis not present

## 2021-03-24 DIAGNOSIS — D124 Benign neoplasm of descending colon: Secondary | ICD-10-CM | POA: Diagnosis not present

## 2021-03-24 DIAGNOSIS — Z86010 Personal history of colonic polyps: Secondary | ICD-10-CM | POA: Diagnosis not present

## 2021-03-24 DIAGNOSIS — D122 Benign neoplasm of ascending colon: Secondary | ICD-10-CM | POA: Diagnosis not present

## 2021-03-26 DIAGNOSIS — D124 Benign neoplasm of descending colon: Secondary | ICD-10-CM | POA: Diagnosis not present

## 2021-03-26 DIAGNOSIS — D122 Benign neoplasm of ascending colon: Secondary | ICD-10-CM | POA: Diagnosis not present

## 2021-03-29 DIAGNOSIS — I10 Essential (primary) hypertension: Secondary | ICD-10-CM | POA: Diagnosis not present

## 2021-03-29 DIAGNOSIS — I509 Heart failure, unspecified: Secondary | ICD-10-CM | POA: Diagnosis not present

## 2021-03-29 DIAGNOSIS — N1831 Chronic kidney disease, stage 3a: Secondary | ICD-10-CM | POA: Diagnosis not present

## 2021-03-29 DIAGNOSIS — D6869 Other thrombophilia: Secondary | ICD-10-CM | POA: Diagnosis not present

## 2021-03-29 DIAGNOSIS — E1151 Type 2 diabetes mellitus with diabetic peripheral angiopathy without gangrene: Secondary | ICD-10-CM | POA: Diagnosis not present

## 2021-03-29 DIAGNOSIS — I2510 Atherosclerotic heart disease of native coronary artery without angina pectoris: Secondary | ICD-10-CM | POA: Diagnosis not present

## 2021-03-29 DIAGNOSIS — I480 Paroxysmal atrial fibrillation: Secondary | ICD-10-CM | POA: Diagnosis not present

## 2021-05-23 ENCOUNTER — Other Ambulatory Visit: Payer: Self-pay | Admitting: Cardiology

## 2021-05-24 ENCOUNTER — Other Ambulatory Visit: Payer: Self-pay

## 2021-05-24 MED ORDER — METOPROLOL SUCCINATE ER 100 MG PO TB24: 100.0000 mg | ORAL_TABLET | Freq: Every day | ORAL | 0 refills | Status: DC

## 2021-05-26 NOTE — Progress Notes (Signed)
Follow up visit  Subjective:   Brandon Morrow, male    DOB: [birth year], 75 y.o.   MRN: [id number]   Chief Complaint  Patient presents with   Longstanding persistent atrial fibrillation    Leg Swelling   Hypertension   Follow-up    4-6 weeks   Atrial Fibrillation    HPI  75 year old male with hypertension, hyperlipidemia, uncontrolled type II diabetes mellitus, persistent atrial fibrillation, CAD, nonischemic cardiomyopathy with recovered EF  Patient is doing well, denies chest pain, shortness of breath, palpitations, leg edema, orthopnea, PND, TIA/syncope. He is trying to walk more. Diabetes remains uncontrolled. He tells me that Dr. Dagmar Hait wanted to start him on a new medication but he is reluctant and wants to make diet and lifestyle changes, instead.   He had a colonoscopy in June 2022 and was found to have some "pre-cancerous polyps" without any bleeding. He is going to have a repeat colonoscopy for polyp removal in 06/2021.    Current Outpatient Medications on File Prior to Visit  Medication Sig Dispense Refill   acetaminophen (TYLENOL) 500 MG tablet Take 500 mg by mouth 3 (three) times daily.      apixaban (ELIQUIS) 5 MG TABS tablet Take 5 mg by mouth 2 (two) times daily.     atorvastatin (LIPITOR) 10 MG tablet Take 5 mg by mouth daily.   1   calcium carbonate (TUMS - DOSED IN MG ELEMENTAL CALCIUM) 500 MG chewable tablet Chew 2 tablets by mouth daily as needed for indigestion or heartburn.     Cholecalciferol (VITAMIN D3) 400 UNITS CAPS Take 400 Units by mouth in the morning and at bedtime.      furosemide (LASIX) 20 MG tablet Take 20 mg by mouth daily.     latanoprost (XALATAN) 0.005 % ophthalmic solution Place 1 drop into both eyes at bedtime.   11   loratadine (CLARITIN) 10 MG tablet Take 10 mg by mouth at bedtime.      metFORMIN (GLUCOPHAGE) 1000 MG tablet Take 1 tablet (1,000 mg total) by mouth 2 (two) times daily.  5   metoprolol succinate (TOPROL-XL) 100 MG 24 hr  tablet Take 1 tablet (100 mg total) by mouth daily. Take with or immediately following a meal. 30 tablet 0   Multiple Vitamins-Minerals (MULTIVITAMIN ADULT PO) Take 1 tablet by mouth daily.     sacubitril-valsartan (ENTRESTO) 97-103 MG Take 1 tablet by mouth 2 (two) times daily.     spironolactone (ALDACTONE) 25 MG tablet TAKE 1/2 TABLET BY MOUTH EVERY DAY 45 tablet 3   No current facility-administered medications on file prior to visit.    Cardiovascular studies:  EKG 05/27/2021: Atrial fibrillation 89 bpm Nonspecific T-abnormality  Echocardiogram 05/04/2020:  Left ventricle cavity is normal in size. Mild concentric hypertrophy of  the left ventricle. Normal global wall motion. Normal LV systolic function  with EF 55%. Unable to evaluate diastolic function due to atrial  fibrillation.  Left atrial cavity is severely dilated at 51 cc/m2.  Mild (Grade I) mitral regurgitation.  Inadequate TR jet to estimate pulmonary artery systolic pressure.  Estimated RA pressure 8 mmHg.  Compared to previous study in 2019, LVEF is improved from 35-40%. LA  dilatation now severe.  Coronary angiogram 01/31/2017:  Proximal to mid LAD high-grade 90% stenosis, mildly calcified. Very minimal luminal irregularity in the circumflex and right coronary artery. Left ventricle mildly dilated, global hypokinesis, EF around 25%. Mildly elevated EDP of 18 mmHg. S/P stenting of proximal and mid  LAD with 3.5 x 22 mm resolute onyx DES, stenosis reduced from 90% to 0% with maintenance of TIMI-3 to TIMI-3 flow.  Exercise sestamibi stress test 09/26/2016: 1. The resting electrocardiogram demonstrated atrial fibrillation, normal resting conduction and normal rest repolarization.  The stress electrocardiogram was normal.  Patient exercised on Bruce protocol for 5:02 minutes and achieved 4.64 METS. Stress test terminated due to 124% MPHR achieved (Target HR >85%), no symptoms. 2.  The perfusion imaging study demonstrates mild  diaphragmatic attenuation artifact, no evidence of ischemia or scar.  LV systolic function calculated by QGS was mild to moderately depressed at 41% with global hypokinesis. Findings may suggest nonischemic cardiomyopathy.  This is an intermediate risk study.   Recent labs: 11/13/2020: BUN/Cr 14/1.2. EGFR 59. HbA1C 8.3% Chol 123, TG 219, HDL 29, LDL 50 TSH 1.4 normal  11/07/2019: Glucose 140, BUN/Cr 16/1.2. EGFR 59.  HbA1C 7.5% Chol 138, TG 235, HDL 31, LDL 60 TSH 1.4 normal  Labs 10/19/2018: BUN/Cr 12/1.0. eGFR >60. H/H 17/53. Platelets 230. HbA1C 7.6% Chol 151, TG 232, HDL 34. LDL 71.   Review of Systems  Cardiovascular:  Positive for leg swelling (Mild, stable). Negative for chest pain, dyspnea on exertion, palpitations and syncope.  Neurological:        Gait instability         Vitals:   05/27/21 0935  BP: 131/80  Pulse: 89  Resp: 16  Temp: 98.2 F (36.8 C)  SpO2: 97%     Body mass index is 29.09 kg/m. Filed Weights   05/27/21 0935  Weight: 197 lb (89.4 kg)    Objective:   Physical Exam Vitals and nursing note reviewed.  Constitutional:      General: He is not in acute distress. Cardiovascular:     Rate and Rhythm: Normal rate. Rhythm irregular.     Heart sounds: Normal heart sounds. No murmur heard. Pulmonary:     Effort: Pulmonary effort is normal.     Breath sounds: Normal breath sounds. No wheezing or rales.  Musculoskeletal:     Right lower leg: Edema (Trace) present.     Left lower leg: Edema (Trace) present.        Assessment & Recommendations:   75 year old male with hypertension, hyperlipidemia, uncontrolled type II diabetes mellitus, persistent atrial fibrillation, coronary artery disease status post proximal to mid LAD PCI 3.5 x 22 mm resolute onyx drug-eluting stent in 01/2017, nonischemic cardiomyopathy  Persistent atrial fibrillation: Controlled ventricular rate and no significant symptoms.  CHA2DS2VASc score 4, annual stroke  risk 5% Continue eliquis 5 mg bid   Nonischemic cardiomyopathy: EF recovered to 50-55% in 2021.  Continue entresto 97-103 mg bid,, metoprolol succinate 100 mg daily, spironolactone 12.5 mg daily.    CAD: No angina symptoms. Lipid well controled.  Not on Aspirin due to ongoing use of eliquis.  Hyperlipidemia: TG remains high at 219, likely due to uncontrolled DM. Continue DM management as per Dr Dagmar Hait  F/u in 1 year   Nigel Mormon, MD [facility] [place] Pager: [phone] Office: [phone] If no answer Cell [phone]

## 2021-05-27 ENCOUNTER — Encounter: Payer: Self-pay | Admitting: Cardiology

## 2021-05-27 ENCOUNTER — Ambulatory Visit: Payer: PPO | Admitting: Cardiology

## 2021-05-27 ENCOUNTER — Other Ambulatory Visit: Payer: Self-pay

## 2021-05-27 VITALS — BP 131/80 | HR 89 | Temp 98.2°F | Resp 16 | Ht 69.0 in | Wt 197.0 lb

## 2021-05-27 DIAGNOSIS — I10 Essential (primary) hypertension: Secondary | ICD-10-CM

## 2021-05-27 DIAGNOSIS — I4811 Longstanding persistent atrial fibrillation: Secondary | ICD-10-CM

## 2021-05-27 DIAGNOSIS — I2510 Atherosclerotic heart disease of native coronary artery without angina pectoris: Secondary | ICD-10-CM | POA: Diagnosis not present

## 2021-06-14 DIAGNOSIS — H43813 Vitreous degeneration, bilateral: Secondary | ICD-10-CM | POA: Diagnosis not present

## 2021-06-14 DIAGNOSIS — H524 Presbyopia: Secondary | ICD-10-CM | POA: Diagnosis not present

## 2021-06-14 DIAGNOSIS — E119 Type 2 diabetes mellitus without complications: Secondary | ICD-10-CM | POA: Diagnosis not present

## 2021-06-14 DIAGNOSIS — H401131 Primary open-angle glaucoma, bilateral, mild stage: Secondary | ICD-10-CM | POA: Diagnosis not present

## 2021-06-18 ENCOUNTER — Other Ambulatory Visit: Payer: Self-pay | Admitting: Cardiology

## 2021-07-02 DIAGNOSIS — R195 Other fecal abnormalities: Secondary | ICD-10-CM | POA: Diagnosis not present

## 2021-07-02 DIAGNOSIS — N1831 Chronic kidney disease, stage 3a: Secondary | ICD-10-CM | POA: Diagnosis not present

## 2021-07-02 DIAGNOSIS — I2510 Atherosclerotic heart disease of native coronary artery without angina pectoris: Secondary | ICD-10-CM | POA: Diagnosis not present

## 2021-07-02 DIAGNOSIS — I509 Heart failure, unspecified: Secondary | ICD-10-CM | POA: Diagnosis not present

## 2021-07-02 DIAGNOSIS — E1151 Type 2 diabetes mellitus with diabetic peripheral angiopathy without gangrene: Secondary | ICD-10-CM | POA: Diagnosis not present

## 2021-07-02 DIAGNOSIS — I480 Paroxysmal atrial fibrillation: Secondary | ICD-10-CM | POA: Diagnosis not present

## 2021-07-02 DIAGNOSIS — D6869 Other thrombophilia: Secondary | ICD-10-CM | POA: Diagnosis not present

## 2021-07-02 DIAGNOSIS — I129 Hypertensive chronic kidney disease with stage 1 through stage 4 chronic kidney disease, or unspecified chronic kidney disease: Secondary | ICD-10-CM | POA: Diagnosis not present

## 2021-07-06 DIAGNOSIS — D120 Benign neoplasm of cecum: Secondary | ICD-10-CM | POA: Diagnosis not present

## 2021-07-06 DIAGNOSIS — Z86010 Personal history of colonic polyps: Secondary | ICD-10-CM | POA: Diagnosis not present

## 2021-07-06 DIAGNOSIS — D124 Benign neoplasm of descending colon: Secondary | ICD-10-CM | POA: Diagnosis not present

## 2021-07-06 DIAGNOSIS — K5730 Diverticulosis of large intestine without perforation or abscess without bleeding: Secondary | ICD-10-CM | POA: Diagnosis not present

## 2021-07-14 DIAGNOSIS — D120 Benign neoplasm of cecum: Secondary | ICD-10-CM | POA: Diagnosis not present

## 2021-07-14 DIAGNOSIS — D124 Benign neoplasm of descending colon: Secondary | ICD-10-CM | POA: Diagnosis not present

## 2021-08-13 DIAGNOSIS — D225 Melanocytic nevi of trunk: Secondary | ICD-10-CM | POA: Diagnosis not present

## 2021-08-13 DIAGNOSIS — L821 Other seborrheic keratosis: Secondary | ICD-10-CM | POA: Diagnosis not present

## 2021-08-13 DIAGNOSIS — Z08 Encounter for follow-up examination after completed treatment for malignant neoplasm: Secondary | ICD-10-CM | POA: Diagnosis not present

## 2021-08-13 DIAGNOSIS — L814 Other melanin hyperpigmentation: Secondary | ICD-10-CM | POA: Diagnosis not present

## 2021-08-13 DIAGNOSIS — C4362 Malignant melanoma of left upper limb, including shoulder: Secondary | ICD-10-CM | POA: Diagnosis not present

## 2021-08-13 DIAGNOSIS — Z872 Personal history of diseases of the skin and subcutaneous tissue: Secondary | ICD-10-CM | POA: Diagnosis not present

## 2021-08-13 DIAGNOSIS — Z85820 Personal history of malignant melanoma of skin: Secondary | ICD-10-CM | POA: Diagnosis not present

## 2021-09-20 DIAGNOSIS — H401131 Primary open-angle glaucoma, bilateral, mild stage: Secondary | ICD-10-CM | POA: Diagnosis not present

## 2021-09-20 DIAGNOSIS — H5340 Unspecified visual field defects: Secondary | ICD-10-CM | POA: Diagnosis not present

## 2021-09-21 ENCOUNTER — Telehealth: Payer: Self-pay

## 2021-09-21 NOTE — Telephone Encounter (Signed)
Okay to use.  Thanks MJP

## 2021-09-21 NOTE — Telephone Encounter (Signed)
Patient called and stated that he recently saw his ophthalmologist and was prescribed Timolol. When he went to pick up the eye drops from the pharmacy, the Pharmacist told him that because he has CHF, he should not be taking these eyedrops. He contacted his eye doctor about it and they said they do not think it will affect anything but he can contact his cardiologist if he wants another opinion. Please advise.

## 2021-09-22 NOTE — Telephone Encounter (Signed)
Called pt no answer left a vm

## 2021-09-24 NOTE — Telephone Encounter (Signed)
Patient received a message and is aware he can use Timolol, per his cardiologists.

## 2021-10-11 DIAGNOSIS — I480 Paroxysmal atrial fibrillation: Secondary | ICD-10-CM | POA: Diagnosis not present

## 2021-10-11 DIAGNOSIS — E1151 Type 2 diabetes mellitus with diabetic peripheral angiopathy without gangrene: Secondary | ICD-10-CM | POA: Diagnosis not present

## 2021-10-11 DIAGNOSIS — I2510 Atherosclerotic heart disease of native coronary artery without angina pectoris: Secondary | ICD-10-CM | POA: Diagnosis not present

## 2021-10-11 DIAGNOSIS — H53489 Generalized contraction of visual field, unspecified eye: Secondary | ICD-10-CM | POA: Diagnosis not present

## 2021-10-11 DIAGNOSIS — E785 Hyperlipidemia, unspecified: Secondary | ICD-10-CM | POA: Diagnosis not present

## 2021-10-11 DIAGNOSIS — H409 Unspecified glaucoma: Secondary | ICD-10-CM | POA: Diagnosis not present

## 2021-10-11 DIAGNOSIS — I509 Heart failure, unspecified: Secondary | ICD-10-CM | POA: Diagnosis not present

## 2021-10-12 ENCOUNTER — Other Ambulatory Visit: Payer: Self-pay | Admitting: Adult Health

## 2021-10-12 DIAGNOSIS — H53489 Generalized contraction of visual field, unspecified eye: Secondary | ICD-10-CM

## 2021-10-15 ENCOUNTER — Inpatient Hospital Stay (HOSPITAL_COMMUNITY): Admission: RE | Admit: 2021-10-15 | Payer: PPO | Source: Ambulatory Visit

## 2021-10-15 ENCOUNTER — Other Ambulatory Visit (HOSPITAL_COMMUNITY): Payer: Self-pay | Admitting: Internal Medicine

## 2021-10-15 DIAGNOSIS — H539 Unspecified visual disturbance: Secondary | ICD-10-CM

## 2021-10-21 ENCOUNTER — Ambulatory Visit (HOSPITAL_COMMUNITY)
Admission: RE | Admit: 2021-10-21 | Discharge: 2021-10-21 | Disposition: A | Discharge status: Home / Self Care | Payer: PPO | Source: Ambulatory Visit | Attending: Internal Medicine | Admitting: Internal Medicine

## 2021-10-21 ENCOUNTER — Other Ambulatory Visit: Payer: Self-pay

## 2021-10-21 DIAGNOSIS — H539 Unspecified visual disturbance: Secondary | ICD-10-CM | POA: Diagnosis not present

## 2021-11-09 ENCOUNTER — Other Ambulatory Visit: Payer: Self-pay

## 2021-11-09 ENCOUNTER — Ambulatory Visit
Admission: RE | Admit: 2021-11-09 | Discharge: 2021-11-09 | Disposition: A | Discharge status: Home / Self Care | Payer: PPO | Source: Ambulatory Visit | Attending: Adult Health | Admitting: Adult Health

## 2021-11-09 DIAGNOSIS — G319 Degenerative disease of nervous system, unspecified: Secondary | ICD-10-CM | POA: Diagnosis not present

## 2021-11-09 DIAGNOSIS — I6381 Other cerebral infarction due to occlusion or stenosis of small artery: Secondary | ICD-10-CM | POA: Diagnosis not present

## 2021-11-09 DIAGNOSIS — I6782 Cerebral ischemia: Secondary | ICD-10-CM | POA: Diagnosis not present

## 2021-11-09 DIAGNOSIS — J329 Chronic sinusitis, unspecified: Secondary | ICD-10-CM | POA: Diagnosis not present

## 2021-11-09 DIAGNOSIS — H53489 Generalized contraction of visual field, unspecified eye: Secondary | ICD-10-CM

## 2021-11-09 MED ORDER — GADOBENATE DIMEGLUMINE 529 MG/ML IV SOLN
18.0000 mL | Freq: Once | INTRAVENOUS | Status: AC | PRN
  Administered 2021-11-09: 18 mL via INTRAVENOUS

## 2021-11-19 DIAGNOSIS — E1151 Type 2 diabetes mellitus with diabetic peripheral angiopathy without gangrene: Secondary | ICD-10-CM | POA: Diagnosis not present

## 2021-11-19 DIAGNOSIS — Z125 Encounter for screening for malignant neoplasm of prostate: Secondary | ICD-10-CM | POA: Diagnosis not present

## 2021-11-19 DIAGNOSIS — E785 Hyperlipidemia, unspecified: Secondary | ICD-10-CM | POA: Diagnosis not present

## 2021-11-19 DIAGNOSIS — I10 Essential (primary) hypertension: Secondary | ICD-10-CM | POA: Diagnosis not present

## 2021-11-26 DIAGNOSIS — Z85828 Personal history of other malignant neoplasm of skin: Secondary | ICD-10-CM | POA: Diagnosis not present

## 2021-11-26 DIAGNOSIS — I129 Hypertensive chronic kidney disease with stage 1 through stage 4 chronic kidney disease, or unspecified chronic kidney disease: Secondary | ICD-10-CM | POA: Diagnosis not present

## 2021-11-26 DIAGNOSIS — H53489 Generalized contraction of visual field, unspecified eye: Secondary | ICD-10-CM | POA: Diagnosis not present

## 2021-11-26 DIAGNOSIS — Z1339 Encounter for screening examination for other mental health and behavioral disorders: Secondary | ICD-10-CM | POA: Diagnosis not present

## 2021-11-26 DIAGNOSIS — L089 Local infection of the skin and subcutaneous tissue, unspecified: Secondary | ICD-10-CM | POA: Diagnosis not present

## 2021-11-26 DIAGNOSIS — N1831 Chronic kidney disease, stage 3a: Secondary | ICD-10-CM | POA: Diagnosis not present

## 2021-11-26 DIAGNOSIS — Z1331 Encounter for screening for depression: Secondary | ICD-10-CM | POA: Diagnosis not present

## 2021-11-26 DIAGNOSIS — I509 Heart failure, unspecified: Secondary | ICD-10-CM | POA: Diagnosis not present

## 2021-11-26 DIAGNOSIS — R82998 Other abnormal findings in urine: Secondary | ICD-10-CM | POA: Diagnosis not present

## 2021-11-26 DIAGNOSIS — D6869 Other thrombophilia: Secondary | ICD-10-CM | POA: Diagnosis not present

## 2021-11-26 DIAGNOSIS — E1151 Type 2 diabetes mellitus with diabetic peripheral angiopathy without gangrene: Secondary | ICD-10-CM | POA: Diagnosis not present

## 2021-11-26 DIAGNOSIS — R195 Other fecal abnormalities: Secondary | ICD-10-CM | POA: Diagnosis not present

## 2021-11-26 DIAGNOSIS — I2510 Atherosclerotic heart disease of native coronary artery without angina pectoris: Secondary | ICD-10-CM | POA: Diagnosis not present

## 2021-11-26 DIAGNOSIS — Z0000 Encounter for general adult medical examination without abnormal findings: Secondary | ICD-10-CM | POA: Diagnosis not present

## 2021-11-26 DIAGNOSIS — I480 Paroxysmal atrial fibrillation: Secondary | ICD-10-CM | POA: Diagnosis not present

## 2021-11-26 DIAGNOSIS — E785 Hyperlipidemia, unspecified: Secondary | ICD-10-CM | POA: Diagnosis not present

## 2021-12-22 DIAGNOSIS — H401131 Primary open-angle glaucoma, bilateral, mild stage: Secondary | ICD-10-CM | POA: Diagnosis not present

## 2022-01-30 ENCOUNTER — Other Ambulatory Visit: Payer: Self-pay | Admitting: Cardiology

## 2022-03-31 DIAGNOSIS — E785 Hyperlipidemia, unspecified: Secondary | ICD-10-CM | POA: Diagnosis not present

## 2022-03-31 DIAGNOSIS — I2510 Atherosclerotic heart disease of native coronary artery without angina pectoris: Secondary | ICD-10-CM | POA: Diagnosis not present

## 2022-03-31 DIAGNOSIS — I129 Hypertensive chronic kidney disease with stage 1 through stage 4 chronic kidney disease, or unspecified chronic kidney disease: Secondary | ICD-10-CM | POA: Diagnosis not present

## 2022-03-31 DIAGNOSIS — I480 Paroxysmal atrial fibrillation: Secondary | ICD-10-CM | POA: Diagnosis not present

## 2022-03-31 DIAGNOSIS — D6869 Other thrombophilia: Secondary | ICD-10-CM | POA: Diagnosis not present

## 2022-03-31 DIAGNOSIS — E1151 Type 2 diabetes mellitus with diabetic peripheral angiopathy without gangrene: Secondary | ICD-10-CM | POA: Diagnosis not present

## 2022-03-31 DIAGNOSIS — H53489 Generalized contraction of visual field, unspecified eye: Secondary | ICD-10-CM | POA: Diagnosis not present

## 2022-03-31 DIAGNOSIS — I509 Heart failure, unspecified: Secondary | ICD-10-CM | POA: Diagnosis not present

## 2022-03-31 DIAGNOSIS — N1831 Chronic kidney disease, stage 3a: Secondary | ICD-10-CM | POA: Diagnosis not present

## 2022-03-31 DIAGNOSIS — R195 Other fecal abnormalities: Secondary | ICD-10-CM | POA: Diagnosis not present

## 2022-04-01 ENCOUNTER — Telehealth: Payer: Self-pay | Admitting: Pharmacist

## 2022-04-01 NOTE — Telephone Encounter (Signed)
Dr. Dagmar Hait mentioned the [facility] AF trial to Mr. Brandon Morrow at his office visit yesterday. The patient wanted more information so I called him today and went over the trial. He wants time to think about it and discuss it with his wife. He said he will discuss with you at your next office visit with him in August. Just wanted to make you aware that I've had the conversation with him.

## 2022-04-01 NOTE — Telephone Encounter (Signed)
Noted. Thank you  MJP

## 2022-04-12 ENCOUNTER — Encounter: Payer: Self-pay | Admitting: Cardiology

## 2022-04-27 DIAGNOSIS — H524 Presbyopia: Secondary | ICD-10-CM | POA: Diagnosis not present

## 2022-04-27 DIAGNOSIS — H401131 Primary open-angle glaucoma, bilateral, mild stage: Secondary | ICD-10-CM | POA: Diagnosis not present

## 2022-04-27 DIAGNOSIS — E119 Type 2 diabetes mellitus without complications: Secondary | ICD-10-CM | POA: Diagnosis not present

## 2022-04-27 DIAGNOSIS — H26493 Other secondary cataract, bilateral: Secondary | ICD-10-CM | POA: Diagnosis not present

## 2022-05-27 ENCOUNTER — Encounter: Payer: Self-pay | Admitting: Cardiology

## 2022-05-27 ENCOUNTER — Ambulatory Visit: Payer: PPO | Admitting: Cardiology

## 2022-05-27 VITALS — BP 132/84 | HR 93 | Temp 97.8°F | Resp 16 | Ht 69.0 in | Wt 199.0 lb

## 2022-05-27 DIAGNOSIS — I4811 Longstanding persistent atrial fibrillation: Secondary | ICD-10-CM

## 2022-05-27 DIAGNOSIS — I2510 Atherosclerotic heart disease of native coronary artery without angina pectoris: Secondary | ICD-10-CM

## 2022-05-27 NOTE — Progress Notes (Signed)
Follow up visit  Subjective:   Brandon Morrow, male    DOB: 01-12-1946, 76 y.o.   MRN: [id number]   Chief Complaint  Patient presents with   Atrial Fibrillation   Follow-up    HPI  76 year old male with hypertension, hyperlipidemia, uncontrolled type II diabetes mellitus, persistent atrial fibrillation, CAD, nonischemic cardiomyopathy with recovered EF  Patient denies chest pain, has mild dyspnea with more than usual exertion.  He lives a very sedentary lifestyle and does not do any regular physical activity.  He admits to not adhering to carb modified diet.  This reflects in his elevated A1c.  He does endorse leg edema.  He is reluctant to start any new medications at this time.  However, he is interested in participating in [place] clinical trial    Current Outpatient Medications:    acetaminophen (TYLENOL) 500 MG tablet, Take 500 mg by mouth 3 (three) times daily., Disp: , Rfl:    apixaban (ELIQUIS) 5 MG TABS tablet, Take 5 mg by mouth 2 (two) times daily., Disp: , Rfl:    atorvastatin (LIPITOR) 10 MG tablet, Take 5 mg by mouth daily. , Disp: , Rfl: 1   calcium carbonate (TUMS - DOSED IN MG ELEMENTAL CALCIUM) 500 MG chewable tablet, Chew 2 tablets by mouth daily as needed for indigestion or heartburn., Disp: , Rfl:    Cholecalciferol (VITAMIN D3) 400 UNITS CAPS, Take 400 Units by mouth in the morning and at bedtime. , Disp: , Rfl:    furosemide (LASIX) 20 MG tablet, Take 20 mg by mouth daily., Disp: , Rfl:    latanoprost (XALATAN) 0.005 % ophthalmic solution, Place 1 drop into both eyes at bedtime. , Disp: , Rfl: 11   loratadine (CLARITIN) 10 MG tablet, Take 10 mg by mouth at bedtime. , Disp: , Rfl:    metFORMIN (GLUCOPHAGE) 1000 MG tablet, Take 1 tablet (1,000 mg total) by mouth 2 (two) times daily., Disp: , Rfl: 5   metoprolol succinate (TOPROL-XL) 100 MG 24 hr tablet, TAKE 1 TABLET BY MOUTH DAILY. TAKE WITH OR IMMEDIATELY FOLLOWING A MEAL., Disp: 30 tablet, Rfl: 11    Multiple Vitamins-Minerals (MULTIVITAMIN ADULT PO), Take 1 tablet by mouth daily., Disp: , Rfl:    sacubitril-valsartan (ENTRESTO) 97-103 MG, Take 1 tablet by mouth 2 (two) times daily., Disp: , Rfl:    spironolactone (ALDACTONE) 25 MG tablet, TAKE 1/2 TABLET BY MOUTH EVERY DAY, Disp: 45 tablet, Rfl: 3  Cardiovascular studies:  EKG 05/27/2022: Atrial fibrillation 82 bpm Low voltage in limb leads   Echocardiogram 05/04/2020:  Left ventricle cavity is normal in size. Mild concentric hypertrophy of  the left ventricle. Normal global wall motion. Normal LV systolic function  with EF 55%. Unable to evaluate diastolic function due to atrial  fibrillation.  Left atrial cavity is severely dilated at 51 cc/m2.  Mild (Grade I) mitral regurgitation.  Inadequate TR jet to estimate pulmonary artery systolic pressure.  Estimated RA pressure 8 mmHg.  Compared to previous study in 2019, LVEF is improved from 35-40%. LA  dilatation now severe.  Coronary angiogram 01/31/2017:  Proximal to mid LAD high-grade 90% stenosis, mildly calcified. Very minimal luminal irregularity in the circumflex and right coronary artery. Left ventricle mildly dilated, global hypokinesis, EF around 25%. Mildly elevated EDP of 18 mmHg. S/P stenting of proximal and mid LAD with 3.5 x 22 mm resolute onyx DES, stenosis reduced from 90% to 0% with maintenance of TIMI-3 to TIMI-3 flow.  Exercise sestamibi stress test  09/26/2016: 1. The resting electrocardiogram demonstrated atrial fibrillation, normal resting conduction and normal rest repolarization.  The stress electrocardiogram was normal.  Patient exercised on Bruce protocol for 5:02 minutes and achieved 4.64 METS. Stress test terminated due to 124% MPHR achieved (Target HR >85%), no symptoms. 2.  The perfusion imaging study demonstrates mild diaphragmatic attenuation artifact, no evidence of ischemia or scar.  LV systolic function calculated by QGS was mild to moderately depressed at  41% with global hypokinesis. Findings may suggest nonischemic cardiomyopathy.  This is an intermediate risk study.   Recent labs: Jan-June 2023: Glucose NA, BUN/Cr 20/NA. EGFR 54.  HbA1C 7.8% Chol 123, TG 226, HDL 29, LDL 49 TSH 0.9 normal  11/13/2020: BUN/Cr 14/1.2. EGFR 59. HbA1C 8.3% Chol 123, TG 219, HDL 29, LDL 50 TSH 1.4 normal  11/07/2019: Glucose 140, BUN/Cr 16/1.2. EGFR 59.  HbA1C 7.5% Chol 138, TG 235, HDL 31, LDL 60 TSH 1.4 normal  Labs 10/19/2018: BUN/Cr 12/1.0. eGFR >60. H/H 17/53. Platelets 230. HbA1C 7.6% Chol 151, TG 232, HDL 34. LDL 71.   Review of Systems  Cardiovascular:  Positive for leg swelling (Mild, stable). Negative for chest pain, dyspnea on exertion, palpitations and syncope.  Neurological:        Gait instability          Vitals:   05/27/22 1036  BP: 132/84  Pulse: 93  Resp: 16  Temp: 97.8 F (36.6 C)  SpO2: 97%     Body mass index is 29.39 kg/m. Filed Weights   05/27/22 1036  Weight: 199 lb (90.3 kg)    Objective:   Physical Exam Vitals and nursing note reviewed.  Constitutional:      General: He is not in acute distress. Cardiovascular:     Rate and Rhythm: Normal rate. Rhythm irregular.     Heart sounds: Normal heart sounds. No murmur heard. Pulmonary:     Effort: Pulmonary effort is normal.     Breath sounds: Normal breath sounds. No wheezing or rales.  Musculoskeletal:     Right lower leg: Edema (Trace) present.     Left lower leg: Edema (Trace) present.         Assessment & Recommendations:   76 year old male with hypertension, hyperlipidemia, uncontrolled type II diabetes mellitus, persistent atrial fibrillation, coronary artery disease status post proximal to mid LAD PCI 3.5 x 22 mm resolute onyx drug-eluting stent in 01/2017, nonischemic cardiomyopathy  Persistent atrial fibrillation: Controlled ventricular rate and no significant symptoms.  CHA2DS2VASc score 4, annual stroke risk 5% Continue eliquis  5 mg bid   Patient is interested in participating in [place] clinical trial A Multicenter, International, Randomized, Active Comparator-controlled, Double-blind, Double-dummy, Parallel-group, 2-arm, Phase 3 Study to Compare the Efficacy and Safety of the Oral FXIa Inhibitor Asundexian ([id number]) With Apixaban for the Prevention of Stroke or Systemic Embolism in Male and Male Participants Aged 5 Years and Older With Atrial Fibrillation at Risk for Stroke  Nonischemic cardiomyopathy: EF recovered to 50-55% in 2021.  Continue entresto 97-103 mg bid,, metoprolol succinate 100 mg daily, spironolactone 12.5 mg daily.   Bilateral leg edema noted.  Recommend echocardiogram.  Ideally, I would like to either increase spironolactone to 25 mg daily (limited by previous report of hyperkalemia), also add [place].  Patient would like to hold off, as he is reluctant to start any new medications.  CAD: No angina symptoms. Lipid well controled.  Not on Aspirin due to ongoing use of eliquis.  Hyperlipidemia: TG remains elevated, likely  due to uncontrolled DM. Continue DM management as per Dr Dagmar Hait  F/u in 6 months   Treon Kehl Esther Hardy, MD [facility] [place] Pager: [phone] Office: [phone] If no answer Cell [phone]

## 2022-06-12 ENCOUNTER — Other Ambulatory Visit: Payer: Self-pay | Admitting: Cardiology

## 2022-06-30 ENCOUNTER — Ambulatory Visit: Payer: PPO

## 2022-06-30 DIAGNOSIS — I4811 Longstanding persistent atrial fibrillation: Secondary | ICD-10-CM

## 2022-07-06 NOTE — Progress Notes (Signed)
Called and spoke to patient he voiced understanding

## 2022-07-26 DIAGNOSIS — D123 Benign neoplasm of transverse colon: Secondary | ICD-10-CM | POA: Diagnosis not present

## 2022-07-26 DIAGNOSIS — K5730 Diverticulosis of large intestine without perforation or abscess without bleeding: Secondary | ICD-10-CM | POA: Diagnosis not present

## 2022-07-26 DIAGNOSIS — K649 Unspecified hemorrhoids: Secondary | ICD-10-CM | POA: Diagnosis not present

## 2022-07-26 DIAGNOSIS — D122 Benign neoplasm of ascending colon: Secondary | ICD-10-CM | POA: Diagnosis not present

## 2022-07-26 DIAGNOSIS — Z86010 Personal history of colonic polyps: Secondary | ICD-10-CM | POA: Diagnosis not present

## 2022-07-26 DIAGNOSIS — Z09 Encounter for follow-up examination after completed treatment for conditions other than malignant neoplasm: Secondary | ICD-10-CM | POA: Diagnosis not present

## 2022-07-28 DIAGNOSIS — D123 Benign neoplasm of transverse colon: Secondary | ICD-10-CM | POA: Diagnosis not present

## 2022-07-28 DIAGNOSIS — D122 Benign neoplasm of ascending colon: Secondary | ICD-10-CM | POA: Diagnosis not present

## 2022-09-01 DIAGNOSIS — H53489 Generalized contraction of visual field, unspecified eye: Secondary | ICD-10-CM | POA: Diagnosis not present

## 2022-09-01 DIAGNOSIS — E1151 Type 2 diabetes mellitus with diabetic peripheral angiopathy without gangrene: Secondary | ICD-10-CM | POA: Diagnosis not present

## 2022-09-01 DIAGNOSIS — I480 Paroxysmal atrial fibrillation: Secondary | ICD-10-CM | POA: Diagnosis not present

## 2022-09-01 DIAGNOSIS — D6869 Other thrombophilia: Secondary | ICD-10-CM | POA: Diagnosis not present

## 2022-09-01 DIAGNOSIS — R195 Other fecal abnormalities: Secondary | ICD-10-CM | POA: Diagnosis not present

## 2022-09-01 DIAGNOSIS — N1831 Chronic kidney disease, stage 3a: Secondary | ICD-10-CM | POA: Diagnosis not present

## 2022-09-01 DIAGNOSIS — Z23 Encounter for immunization: Secondary | ICD-10-CM | POA: Diagnosis not present

## 2022-09-01 DIAGNOSIS — I509 Heart failure, unspecified: Secondary | ICD-10-CM | POA: Diagnosis not present

## 2022-09-01 DIAGNOSIS — I2510 Atherosclerotic heart disease of native coronary artery without angina pectoris: Secondary | ICD-10-CM | POA: Diagnosis not present

## 2022-09-01 DIAGNOSIS — I129 Hypertensive chronic kidney disease with stage 1 through stage 4 chronic kidney disease, or unspecified chronic kidney disease: Secondary | ICD-10-CM | POA: Diagnosis not present

## 2022-09-01 DIAGNOSIS — E785 Hyperlipidemia, unspecified: Secondary | ICD-10-CM | POA: Diagnosis not present

## 2022-11-08 DIAGNOSIS — H401131 Primary open-angle glaucoma, bilateral, mild stage: Secondary | ICD-10-CM | POA: Diagnosis not present

## 2022-11-30 ENCOUNTER — Encounter: Payer: Self-pay | Admitting: Cardiology

## 2022-11-30 ENCOUNTER — Ambulatory Visit: Payer: PPO | Admitting: Cardiology

## 2022-11-30 VITALS — BP 124/72 | HR 98 | Resp 16 | Ht 69.0 in | Wt 198.0 lb

## 2022-11-30 DIAGNOSIS — I428 Other cardiomyopathies: Secondary | ICD-10-CM

## 2022-11-30 DIAGNOSIS — I2510 Atherosclerotic heart disease of native coronary artery without angina pectoris: Secondary | ICD-10-CM

## 2022-11-30 DIAGNOSIS — I4811 Longstanding persistent atrial fibrillation: Secondary | ICD-10-CM

## 2022-11-30 NOTE — Progress Notes (Signed)
Follow up visit  Subjective:   Brandon Morrow, male    DOB: Aug 15, 1946, 77 y.o.   MRN: [id number]   Chief Complaint  Patient presents with   Longstanding persistent atrial fibrillation   Follow-up    HPI  77 year old male with hypertension, hyperlipidemia, uncontrolled type II diabetes mellitus, persistent atrial fibrillation, CAD, nonischemic cardiomyopathy with recovered EF  Patient denies chest pain, continues to have mild dyspnea with more than usual exertion.  He also has bilateral leg edema.    Current Outpatient Medications:    acetaminophen (TYLENOL) 500 MG tablet, Take 500 mg by mouth 3 (three) times daily., Disp: , Rfl:    apixaban (ELIQUIS) 5 MG TABS tablet, Take 5 mg by mouth 2 (two) times daily., Disp: , Rfl:    atorvastatin (LIPITOR) 10 MG tablet, Take 5 mg by mouth daily. , Disp: , Rfl: 1   calcium carbonate (TUMS - DOSED IN MG ELEMENTAL CALCIUM) 500 MG chewable tablet, Chew 2 tablets by mouth daily as needed for indigestion or heartburn., Disp: , Rfl:    Cholecalciferol (VITAMIN D3) 400 UNITS CAPS, Take 400 Units by mouth in the morning and at bedtime. , Disp: , Rfl:    furosemide (LASIX) 20 MG tablet, Take 20 mg by mouth daily., Disp: , Rfl:    latanoprost (XALATAN) 0.005 % ophthalmic solution, Place 1 drop into both eyes at bedtime. , Disp: , Rfl: 11   loratadine (CLARITIN) 10 MG tablet, Take 10 mg by mouth at bedtime. , Disp: , Rfl:    metFORMIN (GLUCOPHAGE) 1000 MG tablet, Take 1 tablet (1,000 mg total) by mouth 2 (two) times daily., Disp: , Rfl: 5   metoprolol succinate (TOPROL-XL) 100 MG 24 hr tablet, TAKE 1 TABLET BY MOUTH EVERY DAY WITH OR IMMEDIATELY FOLLOWING A MEAL, Disp: 90 tablet, Rfl: 3   Multiple Vitamins-Minerals (MULTIVITAMIN ADULT PO), Take 1 tablet by mouth daily., Disp: , Rfl:    sacubitril-valsartan (ENTRESTO) 97-103 MG, Take 1 tablet by mouth 2 (two) times daily., Disp: , Rfl:    spironolactone (ALDACTONE) 25 MG tablet, TAKE 1/2 TABLET BY MOUTH  EVERY DAY, Disp: 45 tablet, Rfl: 3   timolol (TIMOPTIC) 0.25 % ophthalmic solution, 1 drop into left affected eye Ophthalmic Once a day, Disp: , Rfl:   Cardiovascular studies:  EKG 11/30/2022: Atrial fibrillation 101 bpm Low voltage in limb leads  Echocardiogram 06/30/2022:  Normal LV systolic function with visual EF 60-65%. Left ventricle cavity  is normal in size. Normal left ventricular wall thickness. Normal global  wall motion. Unable to evaluate diastolic function due to atrial  fibrillation. Normal LAP.  Left atrial cavity is mildly dilated at 35.28 ml/m^2.  Mild (Grade I) mitral regurgitation.  Compared to 05/04/2020 severe LAE is now mild otherwise no significant  change.   Coronary angiogram 01/31/2017:  Proximal to mid LAD high-grade 90% stenosis, mildly calcified. Very minimal luminal irregularity in the circumflex and right coronary artery. Left ventricle mildly dilated, global hypokinesis, EF around 25%. Mildly elevated EDP of 18 mmHg. S/P stenting of proximal and mid LAD with 3.5 x 22 mm resolute onyx DES, stenosis reduced from 90% to 0% with maintenance of TIMI-3 to TIMI-3 flow.  Exercise sestamibi stress test 09/26/2016: 1. The resting electrocardiogram demonstrated atrial fibrillation, normal resting conduction and normal rest repolarization.  The stress electrocardiogram was normal.  Patient exercised on Bruce protocol for 5:02 minutes and achieved 4.64 METS. Stress test terminated due to 124% MPHR achieved (Target HR >85%), no  symptoms. 2.  The perfusion imaging study demonstrates mild diaphragmatic attenuation artifact, no evidence of ischemia or scar.  LV systolic function calculated by QGS was mild to moderately depressed at 41% with global hypokinesis. Findings may suggest nonischemic cardiomyopathy.  This is an intermediate risk study.   Recent labs: Jan-June 2023: Glucose NA, BUN/Cr 20/NA. EGFR 54.  HbA1C 7.8% Chol 123, TG 226, HDL 29, LDL 49 TSH 0.9  normal  11/13/2020: BUN/Cr 14/1.2. EGFR 59. HbA1C 8.3% Chol 123, TG 219, HDL 29, LDL 50 TSH 1.4 normal  11/07/2019: Glucose 140, BUN/Cr 16/1.2. EGFR 59.  HbA1C 7.5% Chol 138, TG 235, HDL 31, LDL 60 TSH 1.4 normal  Labs 10/19/2018: BUN/Cr 12/1.0. eGFR >60. H/H 17/53. Platelets 230. HbA1C 7.6% Chol 151, TG 232, HDL 34. LDL 71.   Review of Systems  Cardiovascular:  Positive for dyspnea on exertion and leg swelling (Mild, stable). Negative for chest pain, palpitations and syncope.  Neurological:        Gait instability          Vitals:   11/30/22 1136  BP: 124/72  Pulse: 98  Resp: 16  SpO2: 98%     Body mass index is 29.24 kg/m. Filed Weights   11/30/22 1136  Weight: 198 lb (89.8 kg)    Objective:   Physical Exam Vitals and nursing note reviewed.  Constitutional:      General: He is not in acute distress. Cardiovascular:     Rate and Rhythm: Normal rate. Rhythm irregular.     Heart sounds: Normal heart sounds. No murmur heard. Pulmonary:     Effort: Pulmonary effort is normal.     Breath sounds: Normal breath sounds. No wheezing or rales.  Musculoskeletal:     Right lower leg: Edema (Trace) present.     Left lower leg: Edema (Trace) present.         Assessment & Recommendations:   77 year old male with hypertension, hyperlipidemia, uncontrolled type II diabetes mellitus, persistent atrial fibrillation, coronary artery disease status post proximal to mid LAD PCI 3.5 x 22 mm resolute onyx drug-eluting stent in 01/2017, nonischemic cardiomyopathy  Persistent atrial fibrillation: Controlled ventricular rate and no significant symptoms.  CHA2DS2VASc score 4, annual stroke risk 5% Continue eliquis 5 mg bid   Nonischemic cardiomyopathy: EF recovered to 50-55% in 2021.  Continue entresto 97-103 mg bid,, metoprolol succinate 100 mg daily, spironolactone 12.5 mg daily.   Bilateral leg edema and NYHA class II dyspnea symptoms persist. Ideally, I would  like to start him on Farxiga. He has not had any recent lab results, but has upcoming physical with Dr. Dagmar Hait later this month. If GFR is >30, consider starting Farxiga 10 mg daily.   CAD: No angina symptoms. Lipid well controled.  Not on Aspirin due to ongoing use of eliquis.  Hyperlipidemia: TG remains elevated, likely due to uncontrolled DM. Continue DM management as per Dr Dagmar Hait  F/u in 6 months   Nigel Mormon, MD Pager: [phone] Office: [phone]

## 2022-12-15 DIAGNOSIS — E785 Hyperlipidemia, unspecified: Secondary | ICD-10-CM | POA: Diagnosis not present

## 2022-12-15 DIAGNOSIS — E1151 Type 2 diabetes mellitus with diabetic peripheral angiopathy without gangrene: Secondary | ICD-10-CM | POA: Diagnosis not present

## 2022-12-15 DIAGNOSIS — I10 Essential (primary) hypertension: Secondary | ICD-10-CM | POA: Diagnosis not present

## 2022-12-15 DIAGNOSIS — Z125 Encounter for screening for malignant neoplasm of prostate: Secondary | ICD-10-CM | POA: Diagnosis not present

## 2022-12-16 DIAGNOSIS — Z0000 Encounter for general adult medical examination without abnormal findings: Secondary | ICD-10-CM | POA: Diagnosis not present

## 2022-12-16 DIAGNOSIS — I2510 Atherosclerotic heart disease of native coronary artery without angina pectoris: Secondary | ICD-10-CM | POA: Diagnosis not present

## 2022-12-16 DIAGNOSIS — H53489 Generalized contraction of visual field, unspecified eye: Secondary | ICD-10-CM | POA: Diagnosis not present

## 2022-12-16 DIAGNOSIS — I480 Paroxysmal atrial fibrillation: Secondary | ICD-10-CM | POA: Diagnosis not present

## 2022-12-16 DIAGNOSIS — H409 Unspecified glaucoma: Secondary | ICD-10-CM | POA: Diagnosis not present

## 2022-12-16 DIAGNOSIS — N1831 Chronic kidney disease, stage 3a: Secondary | ICD-10-CM | POA: Diagnosis not present

## 2022-12-16 DIAGNOSIS — I509 Heart failure, unspecified: Secondary | ICD-10-CM | POA: Diagnosis not present

## 2022-12-16 DIAGNOSIS — I129 Hypertensive chronic kidney disease with stage 1 through stage 4 chronic kidney disease, or unspecified chronic kidney disease: Secondary | ICD-10-CM | POA: Diagnosis not present

## 2022-12-16 DIAGNOSIS — D6869 Other thrombophilia: Secondary | ICD-10-CM | POA: Diagnosis not present

## 2022-12-16 DIAGNOSIS — E1151 Type 2 diabetes mellitus with diabetic peripheral angiopathy without gangrene: Secondary | ICD-10-CM | POA: Diagnosis not present

## 2022-12-16 DIAGNOSIS — C439 Malignant melanoma of skin, unspecified: Secondary | ICD-10-CM | POA: Diagnosis not present

## 2022-12-16 DIAGNOSIS — R82998 Other abnormal findings in urine: Secondary | ICD-10-CM | POA: Diagnosis not present

## 2022-12-16 DIAGNOSIS — E785 Hyperlipidemia, unspecified: Secondary | ICD-10-CM | POA: Diagnosis not present

## 2023-01-24 ENCOUNTER — Other Ambulatory Visit: Payer: Self-pay | Admitting: Cardiology

## 2023-02-09 DIAGNOSIS — L84 Corns and callosities: Secondary | ICD-10-CM | POA: Diagnosis not present

## 2023-02-09 DIAGNOSIS — Z08 Encounter for follow-up examination after completed treatment for malignant neoplasm: Secondary | ICD-10-CM | POA: Diagnosis not present

## 2023-02-09 DIAGNOSIS — L821 Other seborrheic keratosis: Secondary | ICD-10-CM | POA: Diagnosis not present

## 2023-02-09 DIAGNOSIS — Z872 Personal history of diseases of the skin and subcutaneous tissue: Secondary | ICD-10-CM | POA: Diagnosis not present

## 2023-02-09 DIAGNOSIS — C4362 Malignant melanoma of left upper limb, including shoulder: Secondary | ICD-10-CM | POA: Diagnosis not present

## 2023-02-09 DIAGNOSIS — L814 Other melanin hyperpigmentation: Secondary | ICD-10-CM | POA: Diagnosis not present

## 2023-02-09 DIAGNOSIS — D485 Neoplasm of uncertain behavior of skin: Secondary | ICD-10-CM | POA: Diagnosis not present

## 2023-02-09 DIAGNOSIS — D225 Melanocytic nevi of trunk: Secondary | ICD-10-CM | POA: Diagnosis not present

## 2023-02-09 DIAGNOSIS — L989 Disorder of the skin and subcutaneous tissue, unspecified: Secondary | ICD-10-CM | POA: Diagnosis not present

## 2023-02-09 DIAGNOSIS — Z85820 Personal history of malignant melanoma of skin: Secondary | ICD-10-CM | POA: Diagnosis not present

## 2023-02-14 DIAGNOSIS — I129 Hypertensive chronic kidney disease with stage 1 through stage 4 chronic kidney disease, or unspecified chronic kidney disease: Secondary | ICD-10-CM | POA: Diagnosis not present

## 2023-02-14 DIAGNOSIS — E1151 Type 2 diabetes mellitus with diabetic peripheral angiopathy without gangrene: Secondary | ICD-10-CM | POA: Diagnosis not present

## 2023-02-14 DIAGNOSIS — I509 Heart failure, unspecified: Secondary | ICD-10-CM | POA: Diagnosis not present

## 2023-02-14 DIAGNOSIS — N1831 Chronic kidney disease, stage 3a: Secondary | ICD-10-CM | POA: Diagnosis not present

## 2023-02-14 DIAGNOSIS — I480 Paroxysmal atrial fibrillation: Secondary | ICD-10-CM | POA: Diagnosis not present

## 2023-02-28 DIAGNOSIS — D0359 Melanoma in situ of other part of trunk: Secondary | ICD-10-CM | POA: Diagnosis not present

## 2023-02-28 DIAGNOSIS — L989 Disorder of the skin and subcutaneous tissue, unspecified: Secondary | ICD-10-CM | POA: Diagnosis not present

## 2023-04-18 DIAGNOSIS — R195 Other fecal abnormalities: Secondary | ICD-10-CM | POA: Diagnosis not present

## 2023-04-18 DIAGNOSIS — I509 Heart failure, unspecified: Secondary | ICD-10-CM | POA: Diagnosis not present

## 2023-04-18 DIAGNOSIS — D6869 Other thrombophilia: Secondary | ICD-10-CM | POA: Diagnosis not present

## 2023-04-18 DIAGNOSIS — I130 Hypertensive heart and chronic kidney disease with heart failure and stage 1 through stage 4 chronic kidney disease, or unspecified chronic kidney disease: Secondary | ICD-10-CM | POA: Diagnosis not present

## 2023-04-18 DIAGNOSIS — H53489 Generalized contraction of visual field, unspecified eye: Secondary | ICD-10-CM | POA: Diagnosis not present

## 2023-04-18 DIAGNOSIS — E1151 Type 2 diabetes mellitus with diabetic peripheral angiopathy without gangrene: Secondary | ICD-10-CM | POA: Diagnosis not present

## 2023-04-18 DIAGNOSIS — I480 Paroxysmal atrial fibrillation: Secondary | ICD-10-CM | POA: Diagnosis not present

## 2023-04-18 DIAGNOSIS — N1831 Chronic kidney disease, stage 3a: Secondary | ICD-10-CM | POA: Diagnosis not present

## 2023-04-18 DIAGNOSIS — I2510 Atherosclerotic heart disease of native coronary artery without angina pectoris: Secondary | ICD-10-CM | POA: Diagnosis not present

## 2023-04-18 DIAGNOSIS — E785 Hyperlipidemia, unspecified: Secondary | ICD-10-CM | POA: Diagnosis not present

## 2023-05-09 DIAGNOSIS — H43813 Vitreous degeneration, bilateral: Secondary | ICD-10-CM | POA: Diagnosis not present

## 2023-05-09 DIAGNOSIS — E119 Type 2 diabetes mellitus without complications: Secondary | ICD-10-CM | POA: Diagnosis not present

## 2023-05-09 DIAGNOSIS — H52203 Unspecified astigmatism, bilateral: Secondary | ICD-10-CM | POA: Diagnosis not present

## 2023-05-09 DIAGNOSIS — H04123 Dry eye syndrome of bilateral lacrimal glands: Secondary | ICD-10-CM | POA: Diagnosis not present

## 2023-05-09 DIAGNOSIS — H401131 Primary open-angle glaucoma, bilateral, mild stage: Secondary | ICD-10-CM | POA: Diagnosis not present

## 2023-05-09 DIAGNOSIS — H26493 Other secondary cataract, bilateral: Secondary | ICD-10-CM | POA: Diagnosis not present

## 2023-05-09 DIAGNOSIS — H524 Presbyopia: Secondary | ICD-10-CM | POA: Diagnosis not present

## 2023-05-31 ENCOUNTER — Encounter: Payer: Self-pay | Admitting: Cardiology

## 2023-05-31 ENCOUNTER — Ambulatory Visit: Payer: PPO | Admitting: Cardiology

## 2023-05-31 VITALS — BP 121/73 | HR 89 | Resp 17 | Ht 69.0 in | Wt 193.0 lb

## 2023-05-31 DIAGNOSIS — I428 Other cardiomyopathies: Secondary | ICD-10-CM | POA: Diagnosis not present

## 2023-05-31 DIAGNOSIS — I4811 Longstanding persistent atrial fibrillation: Secondary | ICD-10-CM

## 2023-05-31 NOTE — Progress Notes (Signed)
Follow up visit  Subjective:   Brandon Morrow, male    DOB: 1945-12-05, 77 y.o.   MRN: [id number]   Chief Complaint  Patient presents with   Atrial Fibrillation   Congestive Heart Failure   Follow-up    6 month    HPI  77 year old male with hypertension, hyperlipidemia, uncontrolled type II diabetes mellitus, persistent atrial fibrillation, CAD, nonischemic cardiomyopathy with recovered EF  It appears that he is not started on Farxiga.  He is tolerating it well.  Leg edema somewhat improved.  Exertional dyspnea has improved.   Current Outpatient Medications:    acetaminophen (TYLENOL) 500 MG tablet, Take 500 mg by mouth 3 (three) times daily., Disp: , Rfl:    apixaban (ELIQUIS) 5 MG TABS tablet, Take 5 mg by mouth 2 (two) times daily., Disp: , Rfl:    atorvastatin (LIPITOR) 10 MG tablet, Take 5 mg by mouth daily. , Disp: , Rfl: 1   Cholecalciferol (VITAMIN D3) 400 UNITS CAPS, Take 400 Units by mouth in the morning and at bedtime. , Disp: , Rfl:    furosemide (LASIX) 20 MG tablet, Take 20 mg by mouth daily., Disp: , Rfl:    latanoprost (XALATAN) 0.005 % ophthalmic solution, Place 1 drop into both eyes at bedtime. , Disp: , Rfl: 11   loratadine (CLARITIN) 10 MG tablet, Take 10 mg by mouth at bedtime. , Disp: , Rfl:    metFORMIN (GLUCOPHAGE) 1000 MG tablet, Take 1 tablet (1,000 mg total) by mouth 2 (two) times daily., Disp: , Rfl: 5   metoprolol succinate (TOPROL-XL) 100 MG 24 hr tablet, TAKE 1 TABLET BY MOUTH EVERY DAY WITH OR IMMEDIATELY FOLLOWING A MEAL, Disp: 90 tablet, Rfl: 3   Multiple Vitamins-Minerals (MULTIVITAMIN ADULT PO), Take 1 tablet by mouth daily., Disp: , Rfl:    sacubitril-valsartan (ENTRESTO) 97-103 MG, Take 1 tablet by mouth 2 (two) times daily., Disp: , Rfl:    spironolactone (ALDACTONE) 25 MG tablet, TAKE 1/2 TABLET BY MOUTH EVERY DAY, Disp: 45 tablet, Rfl: 3   timolol (TIMOPTIC) 0.25 % ophthalmic solution, 1 drop into left affected eye Ophthalmic Once a day,  Disp: , Rfl:   Cardiovascular studies:  EKG 11/30/2022: Atrial fibrillation 101 bpm Low voltage in limb leads  Echocardiogram 06/30/2022:  Normal LV systolic function with visual EF 60-65%. Left ventricle cavity  is normal in size. Normal left ventricular wall thickness. Normal global  wall motion. Unable to evaluate diastolic function due to atrial  fibrillation. Normal LAP.  Left atrial cavity is mildly dilated at 35.28 ml/m^2.  Mild (Grade I) mitral regurgitation.  Compared to 05/04/2020 severe LAE is now mild otherwise no significant  change.   Coronary angiogram 01/31/2017:  Proximal to mid LAD high-grade 90% stenosis, mildly calcified. Very minimal luminal irregularity in the circumflex and right coronary artery. Left ventricle mildly dilated, global hypokinesis, EF around 25%. Mildly elevated EDP of 18 mmHg. S/P stenting of proximal and mid LAD with 3.5 x 22 mm resolute onyx DES, stenosis reduced from 90% to 0% with maintenance of TIMI-3 to TIMI-3 flow.  Exercise sestamibi stress test 09/26/2016: 1. The resting electrocardiogram demonstrated atrial fibrillation, normal resting conduction and normal rest repolarization.  The stress electrocardiogram was normal.  Patient exercised on Bruce protocol for 5:02 minutes and achieved 4.64 METS. Stress test terminated due to 124% MPHR achieved (Target HR >85%), no symptoms. 2.  The perfusion imaging study demonstrates mild diaphragmatic attenuation artifact, no evidence of ischemia or scar.  LV  systolic function calculated by QGS was mild to moderately depressed at 41% with global hypokinesis. Findings may suggest nonischemic cardiomyopathy.  This is an intermediate risk study.   Recent labs: Jan-June 2023: Glucose NA, BUN/Cr 20/NA. EGFR 54.  HbA1C 7.8% Chol 123, TG 226, HDL 29, LDL 49 TSH 0.9 normal  11/13/2020: BUN/Cr 14/1.2. EGFR 59. HbA1C 8.3% Chol 123, TG 219, HDL 29, LDL 50 TSH 1.4 normal  11/07/2019: Glucose 140, BUN/Cr 16/1.2.  EGFR 59.  HbA1C 7.5% Chol 138, TG 235, HDL 31, LDL 60 TSH 1.4 normal  Labs 10/19/2018: BUN/Cr 12/1.0. eGFR >60. H/H 17/53. Platelets 230. HbA1C 7.6% Chol 151, TG 232, HDL 34. LDL 71.   Review of Systems  Cardiovascular:  Positive for dyspnea on exertion (Improved) and leg swelling (Mild, stable). Negative for chest pain, palpitations and syncope.  Neurological:        Gait instability          Vitals:   05/31/23 1138  BP: 121/73  Pulse: 89  Resp: 17  SpO2: 97%      Body mass index is 28.5 kg/m. Filed Weights   05/31/23 1138  Weight: 193 lb (87.5 kg)     Objective:   Physical Exam Vitals and nursing note reviewed.  Constitutional:      General: He is not in acute distress. Cardiovascular:     Rate and Rhythm: Normal rate. Rhythm irregular.     Heart sounds: Normal heart sounds. No murmur heard. Pulmonary:     Effort: Pulmonary effort is normal.     Breath sounds: Normal breath sounds. No wheezing or rales.  Musculoskeletal:     Right lower leg: Edema (Trace) present.     Left lower leg: Edema (Trace) present.         Assessment & Recommendations:   77 year old male with hypertension, hyperlipidemia, uncontrolled type II diabetes mellitus, persistent atrial fibrillation, coronary artery disease status post proximal to mid LAD PCI 3.5 x 22 mm resolute onyx drug-eluting stent in 01/2017, nonischemic cardiomyopathy  Persistent atrial fibrillation: Controlled ventricular rate and no significant symptoms.  CHA2DS2VASc score 4, annual stroke risk 5% Continue eliquis 5 mg bid   Nonischemic cardiomyopathy: EF recovered to 50-55% in 2021.  Continue entresto 97-103 mg bid,, metoprolol succinate 100 mg daily, spironolactone 12.5 mg daily, Farxiga 10 mg daily.  CAD: No angina symptoms. Lipid well controled.  Not on Aspirin due to ongoing use of eliquis.  Hyperlipidemia: TG remains elevated, likely due to uncontrolled DM. Continue DM management as per Dr  Felipa Eth  F/u in 1 year     Elder Negus, MD Pager: [phone] Office: [phone]

## 2023-06-02 DIAGNOSIS — Z08 Encounter for follow-up examination after completed treatment for malignant neoplasm: Secondary | ICD-10-CM | POA: Diagnosis not present

## 2023-06-02 DIAGNOSIS — L814 Other melanin hyperpigmentation: Secondary | ICD-10-CM | POA: Diagnosis not present

## 2023-06-02 DIAGNOSIS — Z85820 Personal history of malignant melanoma of skin: Secondary | ICD-10-CM | POA: Diagnosis not present

## 2023-06-02 DIAGNOSIS — L84 Corns and callosities: Secondary | ICD-10-CM | POA: Diagnosis not present

## 2023-06-02 DIAGNOSIS — L821 Other seborrheic keratosis: Secondary | ICD-10-CM | POA: Diagnosis not present

## 2023-06-02 DIAGNOSIS — D225 Melanocytic nevi of trunk: Secondary | ICD-10-CM | POA: Diagnosis not present

## 2023-06-12 ENCOUNTER — Other Ambulatory Visit: Payer: Self-pay | Admitting: Cardiology

## 2023-08-28 DIAGNOSIS — E785 Hyperlipidemia, unspecified: Secondary | ICD-10-CM | POA: Diagnosis not present

## 2023-08-28 DIAGNOSIS — I130 Hypertensive heart and chronic kidney disease with heart failure and stage 1 through stage 4 chronic kidney disease, or unspecified chronic kidney disease: Secondary | ICD-10-CM | POA: Diagnosis not present

## 2023-08-28 DIAGNOSIS — M549 Dorsalgia, unspecified: Secondary | ICD-10-CM | POA: Diagnosis not present

## 2023-08-28 DIAGNOSIS — I480 Paroxysmal atrial fibrillation: Secondary | ICD-10-CM | POA: Diagnosis not present

## 2023-08-28 DIAGNOSIS — I10 Essential (primary) hypertension: Secondary | ICD-10-CM | POA: Diagnosis not present

## 2023-08-28 DIAGNOSIS — D6869 Other thrombophilia: Secondary | ICD-10-CM | POA: Diagnosis not present

## 2023-08-28 DIAGNOSIS — I2510 Atherosclerotic heart disease of native coronary artery without angina pectoris: Secondary | ICD-10-CM | POA: Diagnosis not present

## 2023-08-28 DIAGNOSIS — M79605 Pain in left leg: Secondary | ICD-10-CM | POA: Diagnosis not present

## 2023-08-28 DIAGNOSIS — N1831 Chronic kidney disease, stage 3a: Secondary | ICD-10-CM | POA: Diagnosis not present

## 2023-08-28 DIAGNOSIS — I509 Heart failure, unspecified: Secondary | ICD-10-CM | POA: Diagnosis not present

## 2023-08-28 DIAGNOSIS — E1151 Type 2 diabetes mellitus with diabetic peripheral angiopathy without gangrene: Secondary | ICD-10-CM | POA: Diagnosis not present

## 2023-08-28 DIAGNOSIS — C439 Malignant melanoma of skin, unspecified: Secondary | ICD-10-CM | POA: Diagnosis not present

## 2023-08-28 DIAGNOSIS — R195 Other fecal abnormalities: Secondary | ICD-10-CM | POA: Diagnosis not present

## 2023-08-28 DIAGNOSIS — Z0000 Encounter for general adult medical examination without abnormal findings: Secondary | ICD-10-CM | POA: Diagnosis not present

## 2023-08-30 DIAGNOSIS — M5451 Vertebrogenic low back pain: Secondary | ICD-10-CM | POA: Diagnosis not present

## 2023-09-19 DIAGNOSIS — M5451 Vertebrogenic low back pain: Secondary | ICD-10-CM | POA: Diagnosis not present

## 2023-09-23 DIAGNOSIS — M5451 Vertebrogenic low back pain: Secondary | ICD-10-CM | POA: Diagnosis not present

## 2023-09-25 DIAGNOSIS — M5451 Vertebrogenic low back pain: Secondary | ICD-10-CM | POA: Diagnosis not present

## 2023-09-27 DIAGNOSIS — M5451 Vertebrogenic low back pain: Secondary | ICD-10-CM | POA: Diagnosis not present

## 2023-10-02 DIAGNOSIS — M5451 Vertebrogenic low back pain: Secondary | ICD-10-CM | POA: Diagnosis not present

## 2023-10-03 DIAGNOSIS — H401131 Primary open-angle glaucoma, bilateral, mild stage: Secondary | ICD-10-CM | POA: Diagnosis not present

## 2023-10-03 DIAGNOSIS — H1789 Other corneal scars and opacities: Secondary | ICD-10-CM | POA: Diagnosis not present

## 2023-10-04 DIAGNOSIS — M5451 Vertebrogenic low back pain: Secondary | ICD-10-CM | POA: Diagnosis not present

## 2023-10-06 DIAGNOSIS — M47816 Spondylosis without myelopathy or radiculopathy, lumbar region: Secondary | ICD-10-CM | POA: Diagnosis not present

## 2023-10-10 DIAGNOSIS — M5451 Vertebrogenic low back pain: Secondary | ICD-10-CM | POA: Diagnosis not present

## 2024-01-03 DIAGNOSIS — N1831 Chronic kidney disease, stage 3a: Secondary | ICD-10-CM | POA: Diagnosis not present

## 2024-01-03 DIAGNOSIS — I480 Paroxysmal atrial fibrillation: Secondary | ICD-10-CM | POA: Diagnosis not present

## 2024-01-03 DIAGNOSIS — E785 Hyperlipidemia, unspecified: Secondary | ICD-10-CM | POA: Diagnosis not present

## 2024-01-03 DIAGNOSIS — Z125 Encounter for screening for malignant neoplasm of prostate: Secondary | ICD-10-CM | POA: Diagnosis not present

## 2024-01-03 DIAGNOSIS — E1151 Type 2 diabetes mellitus with diabetic peripheral angiopathy without gangrene: Secondary | ICD-10-CM | POA: Diagnosis not present

## 2024-01-03 DIAGNOSIS — Z1212 Encounter for screening for malignant neoplasm of rectum: Secondary | ICD-10-CM | POA: Diagnosis not present

## 2024-01-03 DIAGNOSIS — I10 Essential (primary) hypertension: Secondary | ICD-10-CM | POA: Diagnosis not present

## 2024-01-03 LAB — LAB REPORT - SCANNED
A1c: 8
EGFR: 59

## 2024-01-10 DIAGNOSIS — D6869 Other thrombophilia: Secondary | ICD-10-CM | POA: Diagnosis not present

## 2024-01-10 DIAGNOSIS — H409 Unspecified glaucoma: Secondary | ICD-10-CM | POA: Diagnosis not present

## 2024-01-10 DIAGNOSIS — C439 Malignant melanoma of skin, unspecified: Secondary | ICD-10-CM | POA: Diagnosis not present

## 2024-01-10 DIAGNOSIS — E1151 Type 2 diabetes mellitus with diabetic peripheral angiopathy without gangrene: Secondary | ICD-10-CM | POA: Diagnosis not present

## 2024-01-10 DIAGNOSIS — M549 Dorsalgia, unspecified: Secondary | ICD-10-CM | POA: Diagnosis not present

## 2024-01-10 DIAGNOSIS — I130 Hypertensive heart and chronic kidney disease with heart failure and stage 1 through stage 4 chronic kidney disease, or unspecified chronic kidney disease: Secondary | ICD-10-CM | POA: Diagnosis not present

## 2024-01-10 DIAGNOSIS — I480 Paroxysmal atrial fibrillation: Secondary | ICD-10-CM | POA: Diagnosis not present

## 2024-01-10 DIAGNOSIS — I509 Heart failure, unspecified: Secondary | ICD-10-CM | POA: Diagnosis not present

## 2024-01-10 DIAGNOSIS — R82998 Other abnormal findings in urine: Secondary | ICD-10-CM | POA: Diagnosis not present

## 2024-01-10 DIAGNOSIS — N1831 Chronic kidney disease, stage 3a: Secondary | ICD-10-CM | POA: Diagnosis not present

## 2024-01-10 DIAGNOSIS — M79605 Pain in left leg: Secondary | ICD-10-CM | POA: Diagnosis not present

## 2024-01-10 DIAGNOSIS — I2510 Atherosclerotic heart disease of native coronary artery without angina pectoris: Secondary | ICD-10-CM | POA: Diagnosis not present

## 2024-01-10 DIAGNOSIS — E785 Hyperlipidemia, unspecified: Secondary | ICD-10-CM | POA: Diagnosis not present

## 2024-01-11 LAB — LAB REPORT - SCANNED
Albumin, Urine POC: 49.6
Creatinine, POC: 87.1 mg/dL
Microalb Creat Ratio: 57

## 2024-01-14 ENCOUNTER — Other Ambulatory Visit: Payer: Self-pay | Admitting: Cardiology

## 2024-02-15 DIAGNOSIS — Z08 Encounter for follow-up examination after completed treatment for malignant neoplasm: Secondary | ICD-10-CM | POA: Diagnosis not present

## 2024-02-15 DIAGNOSIS — Z85820 Personal history of malignant melanoma of skin: Secondary | ICD-10-CM | POA: Diagnosis not present

## 2024-02-15 DIAGNOSIS — D225 Melanocytic nevi of trunk: Secondary | ICD-10-CM | POA: Diagnosis not present

## 2024-02-15 DIAGNOSIS — L814 Other melanin hyperpigmentation: Secondary | ICD-10-CM | POA: Diagnosis not present

## 2024-02-15 DIAGNOSIS — Z86006 Personal history of melanoma in-situ: Secondary | ICD-10-CM | POA: Diagnosis not present

## 2024-02-15 DIAGNOSIS — L821 Other seborrheic keratosis: Secondary | ICD-10-CM | POA: Diagnosis not present

## 2024-04-05 DIAGNOSIS — H04123 Dry eye syndrome of bilateral lacrimal glands: Secondary | ICD-10-CM | POA: Diagnosis not present

## 2024-04-05 DIAGNOSIS — H401131 Primary open-angle glaucoma, bilateral, mild stage: Secondary | ICD-10-CM | POA: Diagnosis not present

## 2024-04-05 DIAGNOSIS — H26493 Other secondary cataract, bilateral: Secondary | ICD-10-CM | POA: Diagnosis not present

## 2024-04-05 DIAGNOSIS — H52203 Unspecified astigmatism, bilateral: Secondary | ICD-10-CM | POA: Diagnosis not present

## 2024-04-05 DIAGNOSIS — H43813 Vitreous degeneration, bilateral: Secondary | ICD-10-CM | POA: Diagnosis not present

## 2024-04-05 DIAGNOSIS — E119 Type 2 diabetes mellitus without complications: Secondary | ICD-10-CM | POA: Diagnosis not present

## 2024-04-05 DIAGNOSIS — H5213 Myopia, bilateral: Secondary | ICD-10-CM | POA: Diagnosis not present

## 2024-04-05 DIAGNOSIS — H524 Presbyopia: Secondary | ICD-10-CM | POA: Diagnosis not present

## 2024-05-15 DIAGNOSIS — D6869 Other thrombophilia: Secondary | ICD-10-CM | POA: Diagnosis not present

## 2024-05-15 DIAGNOSIS — E1151 Type 2 diabetes mellitus with diabetic peripheral angiopathy without gangrene: Secondary | ICD-10-CM | POA: Diagnosis not present

## 2024-05-15 DIAGNOSIS — I480 Paroxysmal atrial fibrillation: Secondary | ICD-10-CM | POA: Diagnosis not present

## 2024-05-15 DIAGNOSIS — N1831 Chronic kidney disease, stage 3a: Secondary | ICD-10-CM | POA: Diagnosis not present

## 2024-05-15 DIAGNOSIS — H409 Unspecified glaucoma: Secondary | ICD-10-CM | POA: Diagnosis not present

## 2024-05-15 DIAGNOSIS — M79605 Pain in left leg: Secondary | ICD-10-CM | POA: Diagnosis not present

## 2024-05-15 DIAGNOSIS — I2510 Atherosclerotic heart disease of native coronary artery without angina pectoris: Secondary | ICD-10-CM | POA: Diagnosis not present

## 2024-05-15 DIAGNOSIS — I509 Heart failure, unspecified: Secondary | ICD-10-CM | POA: Diagnosis not present

## 2024-05-15 DIAGNOSIS — C439 Malignant melanoma of skin, unspecified: Secondary | ICD-10-CM | POA: Diagnosis not present

## 2024-05-15 DIAGNOSIS — I130 Hypertensive heart and chronic kidney disease with heart failure and stage 1 through stage 4 chronic kidney disease, or unspecified chronic kidney disease: Secondary | ICD-10-CM | POA: Diagnosis not present

## 2024-05-15 DIAGNOSIS — E785 Hyperlipidemia, unspecified: Secondary | ICD-10-CM | POA: Diagnosis not present

## 2024-05-15 DIAGNOSIS — M549 Dorsalgia, unspecified: Secondary | ICD-10-CM | POA: Diagnosis not present

## 2024-05-15 LAB — LAB REPORT - SCANNED: A1c: 8

## 2024-05-30 ENCOUNTER — Ambulatory Visit: Payer: Self-pay | Admitting: Cardiology

## 2024-06-09 ENCOUNTER — Other Ambulatory Visit: Payer: Self-pay | Admitting: Cardiology

## 2024-06-20 ENCOUNTER — Ambulatory Visit: Attending: Cardiology | Admitting: Cardiology

## 2024-06-20 ENCOUNTER — Encounter: Payer: Self-pay | Admitting: Cardiology

## 2024-06-20 VITALS — BP 138/80 | HR 68 | Ht 69.0 in | Wt 187.2 lb

## 2024-06-20 DIAGNOSIS — I4811 Longstanding persistent atrial fibrillation: Secondary | ICD-10-CM | POA: Diagnosis not present

## 2024-06-20 DIAGNOSIS — I2510 Atherosclerotic heart disease of native coronary artery without angina pectoris: Secondary | ICD-10-CM | POA: Diagnosis not present

## 2024-06-20 DIAGNOSIS — E782 Mixed hyperlipidemia: Secondary | ICD-10-CM | POA: Diagnosis not present

## 2024-06-20 MED ORDER — FUROSEMIDE 20 MG PO TABS: 20.0000 mg | ORAL_TABLET | Freq: Every day | ORAL | Status: AC | PRN

## 2024-06-20 NOTE — Progress Notes (Signed)
 Cardiology Office Note:  .   Date:  06/20/2024  ID:  Brandon Morrow, DOB Dec 29, 1945, MRN [id number] PCP: Brandon Santos, MD   [facility] Providers Cardiologist:  Brandon Lawrence, MD PCP: Brandon Santos, MD  Chief Complaint  Patient presents with   Cardiomyopathy   Atrial Fibrillation     Brandon Morrow is a 78 y.o. male with hypertension, hyperlipidemia, uncontrolled type II diabetes mellitus, persistent atrial fibrillation, CAD, nonischemic cardiomyopathy with recovered EF   Discussed the use of AI scribe software for clinical note transcription with the patient, who gave verbal consent to proceed.  History of Present Illness  Patient is doing well, denies any complains of chest pain or shortness of breath.  Leg edema has improved since starting Farxiga.  Diabetes remains uncontrolled.  Triglycerides higher than before on check in 12/2023.  Patient admits to eating frequent high sugar snacks.  He has regular follow-up with Dr. Janey where labs are routinely checked.     Vitals:   06/20/24 1335  BP: 138/80  Pulse: 68  SpO2: 94%      Review of Systems  Cardiovascular:  Negative for chest pain, dyspnea on exertion, leg swelling, palpitations and syncope.        Studies Reviewed: SABRA     EKG 06/20/2024: Atrial fibrillation 104 bpm Nonspecific T wave abnormality When compared with ECG of 01-Feb-2017 07:02, QRS voltage has decreased    Echocardiogram 2023: Normal LV systolic function with visual EF 60-65%. Left ventricle cavity is normal in size. Normal left ventricular wall thickness. Normal global wall motion. Unable to evaluate diastolic function due to atrial fibrillation. Normal LAP. Left atrial cavity is mildly dilated at 35.28 ml/m^2. Mild (Grade I) mitral regurgitation. Compared to 05/04/2020 severe LAE is now mild otherwise no significant change.   Labs 12/2023: Chol 150, TG 440, HDL 35, LDL 27 HbA1C 8.0% Hb 17.6 Cr 1.2 TSH 0.9  Risk  Assessment/Calculations:    CHA2DS2-VASc Score = 5   This indicates a 7.2% annual risk of stroke. The patient's score is based upon: CHF History: 1 HTN History: 1 Diabetes History: 1 Stroke History: 0 Vascular Disease History: 0 Age Score: 2 Gender Score: 0    Physical Exam Vitals and nursing note reviewed.  Constitutional:      General: He is not in acute distress. Neck:     Vascular: No JVD.  Cardiovascular:     Rate and Rhythm: Normal rate and regular rhythm.     Heart sounds: Normal heart sounds. No murmur heard. Pulmonary:     Effort: Pulmonary effort is normal.     Breath sounds: Normal breath sounds. No wheezing or rales.  Musculoskeletal:     Right lower leg: Edema (Trace) present.     Left lower leg: Edema (Trace) present.      VISIT DIAGNOSES:   ICD-10-CM   1. Coronary artery disease involving native coronary artery of native heart without angina pectoris  I25.10 EKG 12-Lead    2. Mixed hyperlipidemia  E78.2     3. Longstanding persistent atrial fibrillation [facility])  I48.11        Brandon Morrow is a 78 y.o. male with hypertension, hyperlipidemia, uncontrolled type II diabetes mellitus, persistent atrial fibrillation, CAD, nonischemic cardiomyopathy with recovered EF  Assessment & Plan  Persistent atrial fibrillation: Controlled ventricular rate and no significant symptoms.  CHA2DS2VASc score 4, annual stroke risk 5% Continue eliquis  5 mg bid    Nonischemic cardiomyopathy: EF recovered to 50-55% in  2021.  Continue entresto 97-103 mg bid,, metoprolol  succinate 100 mg daily, spironolactone  12.5 mg daily, Farxiga 10 mg daily.   CAD: No angina symptoms. Lipid well controled.  Not on Aspirin  due to ongoing use of eliquis .   Mixed hyperlipidemia: Triglycerides in 400s in 12/2023.  He is not too keen on starting any new medications at this time.  Strongly recommend avoiding high sugar snacks.  He will have fasting lipid panel through Dr. Janey in the next  few months.  If triglycerides remain elevated, recommend adding fenofibrate at at least 48 mg daily or Vascepa 2 g twice daily.       Meds ordered this encounter  Medications   furosemide  (LASIX ) 20 MG tablet    Sig: Take 1 tablet (20 mg total) by mouth daily as needed for fluid or edema.     F/u in 6 months  Signed, Brandon JINNY Lawrence, MD

## 2024-06-20 NOTE — Patient Instructions (Addendum)
 MEDICATION: Change lasix  to as needed   Follow-Up: At [facility], you and your health needs are our priority.  As part of our continuing mission to provide you with exceptional heart care, our providers are all part of one team.  This team includes your primary Cardiologist (physician) and Advanced Practice Providers or APPs (Physician Assistants and Nurse Practitioners) who all work together to provide you with the care you need, when you need it.  Your next appointment:   6 month(s)  Provider:   Newman JINNY Lawrence, MD    We recommend signing up for the patient portal called MyChart.  Sign up information is provided on this After Visit Summary.  MyChart is used to connect with patients for Virtual Visits (Telemedicine).  Patients are able to view lab/test results, encounter notes, upcoming appointments, etc.  Non-urgent messages can be sent to your provider as well.   To learn more about what you can do with MyChart, go to [email or web address].

## 2024-07-04 ENCOUNTER — Other Ambulatory Visit: Payer: Self-pay | Admitting: Cardiology

## 2024-07-11 DIAGNOSIS — H401131 Primary open-angle glaucoma, bilateral, mild stage: Secondary | ICD-10-CM | POA: Diagnosis not present

## 2024-07-11 DIAGNOSIS — H26491 Other secondary cataract, right eye: Secondary | ICD-10-CM | POA: Diagnosis not present

## 2024-07-11 DIAGNOSIS — H04123 Dry eye syndrome of bilateral lacrimal glands: Secondary | ICD-10-CM | POA: Diagnosis not present

## 2024-07-14 ENCOUNTER — Other Ambulatory Visit: Payer: Self-pay | Admitting: Cardiology

## 2024-08-19 ENCOUNTER — Telehealth (HOSPITAL_BASED_OUTPATIENT_CLINIC_OR_DEPARTMENT_OTHER): Payer: Self-pay

## 2024-08-19 DIAGNOSIS — I4891 Unspecified atrial fibrillation: Secondary | ICD-10-CM | POA: Diagnosis not present

## 2024-08-19 DIAGNOSIS — Z860101 Personal history of adenomatous and serrated colon polyps: Secondary | ICD-10-CM | POA: Diagnosis not present

## 2024-08-19 DIAGNOSIS — R195 Other fecal abnormalities: Secondary | ICD-10-CM | POA: Diagnosis not present

## 2024-08-19 NOTE — Telephone Encounter (Signed)
   Pre-operative Risk Assessment    Patient Name: Brandon Morrow  DOB: 08-25-1946 MRN: [id number]   Date of last office visit: 06/20/2024 with Newman Lawrence, MD Date of next office visit: N/A   Request for Surgical Clearance    Procedure:  Colonoscopy - Hx of adenomatous colonic polyps   Date of Surgery:  Clearance 09/11/24                                 Surgeon:  Dr. Oliva Boots  Surgeon's Group or Practice Name:  [facility]  Phone number:  [phone] Fax number:  [phone]   Type of Clearance Requested:   - Medical  - Pharmacy:  Hold Apixaban  (Eliquis ) Hold 2 days prior   Type of Anesthesia:  Propofol    Additional requests/questions:  Please fax back completed form to [facility] [phone] and fax above direct fax [phone].  SignedPatrcia Hong L   08/19/2024, 11:49 AM

## 2024-08-20 DIAGNOSIS — Z85820 Personal history of malignant melanoma of skin: Secondary | ICD-10-CM | POA: Diagnosis not present

## 2024-08-20 DIAGNOSIS — Z08 Encounter for follow-up examination after completed treatment for malignant neoplasm: Secondary | ICD-10-CM | POA: Diagnosis not present

## 2024-08-20 DIAGNOSIS — Z86006 Personal history of melanoma in-situ: Secondary | ICD-10-CM | POA: Diagnosis not present

## 2024-08-20 DIAGNOSIS — L814 Other melanin hyperpigmentation: Secondary | ICD-10-CM | POA: Diagnosis not present

## 2024-08-20 DIAGNOSIS — L821 Other seborrheic keratosis: Secondary | ICD-10-CM | POA: Diagnosis not present

## 2024-08-20 DIAGNOSIS — D1801 Hemangioma of skin and subcutaneous tissue: Secondary | ICD-10-CM | POA: Diagnosis not present

## 2024-08-20 NOTE — Telephone Encounter (Signed)
 Will order labs for preop clearance. Will confirm if pt is going to need an appt as well after labs have been done with time allowance for pharm d recommendations.

## 2024-08-20 NOTE — Telephone Encounter (Signed)
 Pt said he had recent labs with PCP Dr. Janey. I stated I will call PCP and see if they did a CBC and BMP. I will then call back and will set up tele appt as well.   I left message for Corean, Dr. Zadie nurse to call back and let our office know if they have a recent BMP and CBC and if so please fax to [phone] attn; preop team. I also asked for a call back to preop line as well [phone].

## 2024-08-23 NOTE — Telephone Encounter (Signed)
 S/w Rexene with Dr. Zadie office who will fax labs from 01/03/2024, pt had a CMP and CBC as well as other labs. Once I have these labs I will update the preop APP.

## 2024-08-23 NOTE — Telephone Encounter (Signed)
 Still waiting to receive labs from PCP

## 2024-08-26 NOTE — Telephone Encounter (Signed)
 Labs from PCP has been received and scanned into the pt's chart.    CBC 01/03/24 PLT 236 WBC 10.50 HGB15.8  BMP 01/03/24 CREATININE 1.2 K+ 4.7 BUN 26

## 2024-08-26 NOTE — Telephone Encounter (Signed)
 Patient with diagnosis of afib on Eliquis  for anticoagulation.    Procedure: Colonoscopy - Hx of adenomatous colonic polyps  Date of procedure: 09/11/24   CHA2DS2-VASc Score = 6   This indicates a 9.7% annual risk of stroke. The patient's score is based upon: CHF History: 1 HTN History: 1 Diabetes History: 1 Stroke History: 0 Vascular Disease History: 1 Age Score: 2 Gender Score: 0      CrCl 62 ml/min Platelet count 236  Patient has not had an Afib/aflutter ablation in the last 3 months, DCCV within the last 4 weeks or a watchman implanted in the last 45 days   Per office protocol, patient can hold Eliquis  for 2 days prior to procedure.    **This guidance is not considered finalized until pre-operative APP has relayed final recommendations.**

## 2024-08-27 ENCOUNTER — Telehealth (HOSPITAL_BASED_OUTPATIENT_CLINIC_OR_DEPARTMENT_OTHER): Payer: Self-pay | Admitting: *Deleted

## 2024-08-27 NOTE — Telephone Encounter (Signed)
 Patient is returning call.

## 2024-08-27 NOTE — Telephone Encounter (Signed)
 Pt has been scheduled tele preop appt 09/05/24. Med rec and consent are done.

## 2024-08-27 NOTE — Telephone Encounter (Signed)
 Primary Cardiologist:Manish JINNY Lawrence, MD   Preoperative team, please contact this patient and set up a phone call appointment for further preoperative risk assessment. Please obtain consent and complete medication review. Thank you for your help.   I confirm that guidance regarding antiplatelet and oral anticoagulation therapy has been completed and, if necessary, noted below.  Per office protocol, he may hold Eliquis  for 2 days prior to procedure and should resume as soon as hemodynamically stable postoperatively.  I also confirmed the patient resides in the state of [place] . As per [facility] Medical Board telemedicine laws, the patient must reside in the state in which the provider is licensed.   Rosaline EMERSON Bane, NP-C  08/27/2024, 8:11 AM [address], [address] [place], [place] [address] Office [phone] Fax [phone]

## 2024-08-27 NOTE — Telephone Encounter (Signed)
 Pt has been scheduled tele preop appt 09/05/24. Med rec and consent are done.       Patient Consent for Virtual Visit        Brandon Morrow has provided verbal consent on 08/27/2024 for a virtual visit (video or telephone).   CONSENT FOR VIRTUAL VISIT FOR:  Brandon Morrow  By participating in this virtual visit I agree to the following:  I hereby voluntarily request, consent and authorize [facility] and its employed or contracted physicians, physician assistants, nurse practitioners or other licensed health care professionals (the Practitioner), to provide me with telemedicine health care services (the "Services) as deemed necessary by the treating Practitioner. I acknowledge and consent to receive the Services by the Practitioner via telemedicine. I understand that the telemedicine visit will involve communicating with the Practitioner through live audiovisual communication technology and the disclosure of certain medical information by electronic transmission. I acknowledge that I have been given the opportunity to request an in-person assessment or other available alternative prior to the telemedicine visit and am voluntarily participating in the telemedicine visit.  I understand that I have the right to withhold or withdraw my consent to the use of telemedicine in the course of my care at any time, without affecting my right to future care or treatment, and that the Practitioner or I may terminate the telemedicine visit at any time. I understand that I have the right to inspect all information obtained and/or recorded in the course of the telemedicine visit and may receive copies of available information for a reasonable fee.  I understand that some of the potential risks of receiving the Services via telemedicine include:  Delay or interruption in medical evaluation due to technological equipment failure or disruption; Information transmitted may not be sufficient (e.g. poor  resolution of images) to allow for appropriate medical decision making by the Practitioner; and/or  In rare instances, security protocols could fail, causing a breach of personal health information.  Furthermore, I acknowledge that it is my responsibility to provide information about my medical history, conditions and care that is complete and accurate to the best of my ability. I acknowledge that Practitioner's advice, recommendations, and/or decision may be based on factors not within their control, such as incomplete or inaccurate data provided by me or distortions of diagnostic images or specimens that may result from electronic transmissions. I understand that the practice of medicine is not an exact science and that Practitioner makes no warranties or guarantees regarding treatment outcomes. I acknowledge that a copy of this consent can be made available to me via my patient portal [facility] MyChart), or I can request a printed copy by calling the office of [facility].    I understand that my insurance will be billed for this visit.   I have read or had this consent read to me. I understand the contents of this consent, which adequately explains the benefits and risks of the Services being provided via telemedicine.  I have been provided ample opportunity to ask questions regarding this consent and the Services and have had my questions answered to my satisfaction. I give my informed consent for the services to be provided through the use of telemedicine in my medical care

## 2024-08-27 NOTE — Telephone Encounter (Signed)
 Left message to call back to schedule tele pre op appt.

## 2024-09-04 NOTE — Progress Notes (Signed)
 Virtual Visit via Telephone Note   Because of Brandon Morrow co-morbid illnesses, he is at least at moderate risk for complications without adequate follow up.  This format is felt to be most appropriate for this patient at this time.  Due to technical limitations with video connection (technology), today's appointment will be conducted as an audio only telehealth visit, and Brandon Morrow verbally agreed to proceed in this manner.   All issues noted in this document were discussed and addressed.  No physical exam could be performed with this format.  Evaluation Performed:  Preoperative cardiovascular risk assessment _____________   Date:  09/04/2024   Patient ID:  Brandon Morrow, DOB [birth year], MRN [id number] Patient Location:  Home Provider location:   Office  Primary Care Provider:  Janey Santos, MD Primary Cardiologist:  Newman JINNY Lawrence, MD  Chief Complaint / Patient Profile   78 y.o. y/o male with a h/o hypertension, nonischemic cardiomyopathy, hyperlipidemia, type 2 diabetes who is pending colonoscopy and presents today for telephonic preoperative cardiovascular risk assessment.  History of Present Illness    Brandon Morrow is a 78 y.o. male who presents via audio/video conferencing for a telehealth visit today.  Pt was last seen in cardiology clinic on 06/20/2024 by Dr. Lawrence.  At that time DAVARIS Morrow was doing well .  The patient is now pending procedure as outlined above. Since his last visit, he remains stable from a cardiac standpoint.  Today he denies chest pain, shortness of breath, lower extremity edema, fatigue, palpitations, melena, hematuria, hemoptysis, diaphoresis, weakness, presyncope, syncope, orthopnea, and PND.   Past Medical History    Past Medical History:  Diagnosis Date   Arthritis    starting in my knees, hands/fingers (01/31/2017)   CHF (congestive heart failure) (HCC)    Coronary artery disease    GERD (gastroesophageal  reflux disease)    High cholesterol    Hypertension    Melanoma (HCC) 2010   left upper arm    Pneumonia ~ 1990s   Type II diabetes mellitus (HCC)    Past Surgical History:  Procedure Laterality Date   CARDIOVERSION N/A 10/25/2016   Procedure: CARDIOVERSION;  Surgeon: Gordy Bergamo, MD;  Location: [facility] ENDOSCOPY;  Service: Cardiovascular;  Laterality: N/A;   CATARACT EXTRACTION W/ INTRAOCULAR LENS  IMPLANT, BILATERAL Bilateral    COLONOSCOPY  last one 10-07-2015   CORONARY ANGIOPLASTY WITH STENT PLACEMENT     CORONARY STENT INTERVENTION N/A 01/31/2017   Procedure: Coronary Stent Intervention;  Surgeon: Gordy Bergamo, MD;  Location: [facility] INVASIVE CV LAB;  Service: Cardiovascular;  Laterality: N/A;  prox lad   CYSTOSCOPY WITH RETROGRADE PYELOGRAM, URETEROSCOPY AND STENT PLACEMENT Left 10/12/2015   Procedure: LEFT RETROGRADE PYELOGRAM, LEFT URETEROSCOPY, LASER LITHOTRIPSY  AND STENT PLACEMENT;  Surgeon: Mark Ottelin, MD;  Location: [facility] [place];  Service: Urology;  Laterality: Left;   HOLMIUM LASER APPLICATION Left 10/12/2015   Procedure: HOLMIUM LASER APPLICATION;  Surgeon: Mark Ottelin, MD;  Location: [facility];  Service: Urology;  Laterality: Left;   KNEE ARTHROSCOPY Bilateral 2000s   LEFT HEART CATH AND CORONARY ANGIOGRAPHY N/A 01/31/2017   Procedure: Left Heart Cath and Coronary Angiography;  Surgeon: Gordy Bergamo, MD;  Location: [facility] INVASIVE CV LAB;  Service: Cardiovascular;  Laterality: N/A;   LITHOTRIPSY  2016   MELANOMA EXCISION Left 2010   upper arm    NEGATIVE SLEEP STUDY  YRS AGO per pt    Allergies  Allergies  Allergen  Reactions   Codeine Itching   Oxycodone  Itching   Sulfa Antibiotics Rash    Home Medications    Prior to Admission medications   Medication Sig Start Date End Date Taking? Authorizing Provider  acetaminophen  (TYLENOL ) 500 MG tablet Take 500 mg by mouth 3 (three) times daily.    [provider]  apixaban  (ELIQUIS ) 5 MG TABS  tablet Take 5 mg by mouth 2 (two) times daily. 09/23/16   [provider]  atorvastatin  (LIPITOR) 10 MG tablet Take 5 mg by mouth daily.  05/05/15   [provider]  Cholecalciferol  (VITAMIN D3) 400 UNITS CAPS Take 400 Units by mouth in the morning and at bedtime.     [provider]  FARXIGA 10 MG TABS tablet Take 10 mg by mouth daily.    [provider]  furosemide  (LASIX ) 20 MG tablet Take 1 tablet (20 mg total) by mouth daily as needed for fluid or edema. 06/20/24   Patwardhan, Newman PARAS, MD  latanoprost  (XALATAN ) 0.005 % ophthalmic solution Place 1 drop into both eyes at bedtime.  05/26/15   [provider]  loratadine  (CLARITIN ) 10 MG tablet Take 10 mg by mouth at bedtime.     [provider]  metFORMIN  (GLUCOPHAGE ) 1000 MG tablet Take 1 tablet (1,000 mg total) by mouth 2 (two) times daily. 02/02/17   Ladona Heinz, MD  metoprolol  succinate (TOPROL -XL) 100 MG 24 hr tablet TAKE 1 TABLET BY MOUTH EVERY DAY WITH OR IMMEDIATELY FOLLOWING A MEAL 07/05/24   Patwardhan, Newman PARAS, MD  Multiple Vitamins-Minerals (MULTIVITAMIN ADULT PO) Take 1 tablet by mouth daily.    [provider]  sacubitril-valsartan (ENTRESTO) 97-103 MG Take 1 tablet by mouth 2 (two) times daily.    [provider]  spironolactone  (ALDACTONE ) 25 MG tablet TAKE 1/2 TABLET BY MOUTH DAILY 07/15/24   Patwardhan, Manish J, MD  timolol (TIMOPTIC) 0.25 % ophthalmic solution 1 drop into left affected eye Ophthalmic Once a day Patient taking differently: Place 1 drop into both eyes daily.    [provider]    Physical Exam    Vital Signs:  Brandon Morrow does not have vital signs available for review today.  Given telephonic nature of communication, physical exam is limited. AAOx3. NAD. Normal affect.  Speech and respirations are unlabored.  Accessory Clinical Findings    None  Assessment & Plan    1.  Preoperative Cardiovascular Risk Assessment:  Colonoscopy - Hx of adenomatous colonic polyps    Date of Surgery:  Clearance 09/11/24                                  Surgeon:  Dr. Oliva Boots  Surgeon's Group or Practice Name:  [facility]  Phone number:  [phone] Fax number:  [phone]      Primary Cardiologist: Newman PARAS Lawrence, MD  Chart reviewed as part of pre-operative protocol coverage. Given past medical history and time since last visit, based on ACC/AHA guidelines, SAKIB NOGUEZ would be at acceptable risk for the planned procedure without further cardiovascular testing.   Patient was advised that if he develops new symptoms prior to surgery to contact our office to arrange a follow-up appointment.  He verbalized understanding.  Per office protocol, he may hold Eliquis  for 2 days prior to procedure and should resume as soon as hemodynamically stable postoperatively.   I will route this recommendation to  the requesting party via Epic fax function and remove from pre-op  pool.       Time:   Today, I have spent 5 minutes with the patient with telehealth technology discussing medical history, symptoms, and management plan.  I spent 10 minutes reviewing patient's past cardiac history and cardiac medications.    Josefa CHRISTELLA Beauvais, NP  09/04/2024, 9:05 AM

## 2024-09-05 ENCOUNTER — Ambulatory Visit: Attending: Internal Medicine

## 2024-09-05 DIAGNOSIS — Z01810 Encounter for preprocedural cardiovascular examination: Secondary | ICD-10-CM | POA: Diagnosis not present

## 2024-09-11 DIAGNOSIS — Z860101 Personal history of adenomatous and serrated colon polyps: Secondary | ICD-10-CM | POA: Diagnosis not present

## 2024-09-11 DIAGNOSIS — Z9889 Other specified postprocedural states: Secondary | ICD-10-CM | POA: Diagnosis not present

## 2024-09-11 DIAGNOSIS — Z09 Encounter for follow-up examination after completed treatment for conditions other than malignant neoplasm: Secondary | ICD-10-CM | POA: Diagnosis not present

## 2024-09-11 DIAGNOSIS — K5730 Diverticulosis of large intestine without perforation or abscess without bleeding: Secondary | ICD-10-CM | POA: Diagnosis not present
